# Patient Record
Sex: Male | Born: 1985 | Race: White | Hispanic: No | Marital: Single | Smoking: Current every day smoker
Health system: Southern US, Community
[De-identification: ages and names within clinical notes are randomized; demographics above are authoritative.]

## PROBLEM LIST (undated history)

## (undated) DIAGNOSIS — I10 Essential (primary) hypertension: Secondary | ICD-10-CM

## (undated) DIAGNOSIS — M549 Dorsalgia, unspecified: Secondary | ICD-10-CM

## (undated) DIAGNOSIS — F319 Bipolar disorder, unspecified: Secondary | ICD-10-CM

## (undated) DIAGNOSIS — R569 Unspecified convulsions: Secondary | ICD-10-CM

## (undated) HISTORY — PX: APPENDECTOMY: SHX54

---
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
## 2009-08-28 ENCOUNTER — Encounter (INDEPENDENT_AMBULATORY_CARE_PROVIDER_SITE_OTHER): Payer: Self-pay | Admitting: Surgery

## 2009-08-28 ENCOUNTER — Inpatient Hospital Stay (HOSPITAL_COMMUNITY): Admission: EM | Admit: 2009-08-28 | Discharge: 2009-08-30 | Payer: Self-pay | Admitting: Emergency Medicine

## 2010-12-02 ENCOUNTER — Emergency Department (HOSPITAL_COMMUNITY)
Admission: EM | Admit: 2010-12-02 | Discharge: 2010-12-02 | Disposition: A | Discharge status: Home / Self Care | Payer: Self-pay | Attending: Emergency Medicine | Admitting: Emergency Medicine

## 2010-12-02 DIAGNOSIS — F17200 Nicotine dependence, unspecified, uncomplicated: Secondary | ICD-10-CM | POA: Insufficient documentation

## 2010-12-02 DIAGNOSIS — F319 Bipolar disorder, unspecified: Secondary | ICD-10-CM | POA: Insufficient documentation

## 2010-12-02 LAB — BASIC METABOLIC PANEL
CO2: 25 mEq/L (ref 19–32)
Chloride: 108 mEq/L (ref 96–112)
GFR calc Af Amer: >60 mL/min (ref >60)
Sodium: 142 mEq/L (ref 135–145)

## 2010-12-02 LAB — DIFFERENTIAL
Basophils Absolute: 0 10*3/uL (ref 0.0–0.1)
Basophils Relative: 0 % (ref 0–1)
Eosinophils Absolute: 0.3 10*3/uL (ref 0.0–0.7)
Eosinophils Relative: 2 % (ref 0–5)
Lymphocytes Relative: 20 % (ref 12–46)
Lymphs Abs: 2.5 10*3/uL (ref 0.7–4.0)
Neutrophils Relative %: 68 % (ref 43–77)

## 2010-12-02 LAB — CBC
HCT: 47.9 % (ref 39.0–52.0)
MCHC: 35.5 g/dL (ref 30.0–36.0)
Platelets: 216 10*3/uL (ref 150–400)
RBC: 5.41 MIL/uL (ref 4.22–5.81)
RDW: 12.9 % (ref 11.5–15.5)
WBC: 12.6 10*3/uL — ABNORMAL HIGH (ref 4.0–10.5)

## 2011-01-24 LAB — COMPREHENSIVE METABOLIC PANEL
ALT: 41 U/L (ref 0–53)
BUN: 5 mg/dL — ABNORMAL LOW (ref 6–23)
BUN: 8 mg/dL (ref 6–23)
CO2: 25 mEq/L (ref 19–32)
CO2: 26 mEq/L (ref 19–32)
Calcium: 8.7 mg/dL (ref 8.4–10.5)
Calcium: 8.8 mg/dL (ref 8.4–10.5)
Creatinine, Ser: 0.92 mg/dL (ref 0.4–1.5)
Creatinine, Ser: 1.04 mg/dL (ref 0.4–1.5)
GFR calc Af Amer: >60 mL/min (ref >60)
GFR calc non Af Amer: >60 mL/min (ref >60)
GFR calc non Af Amer: >60 mL/min (ref >60)
Glucose, Bld: 101 mg/dL — ABNORMAL HIGH (ref 70–99)
Glucose, Bld: 101 mg/dL — ABNORMAL HIGH (ref 70–99)

## 2011-01-24 LAB — CBC
HCT: 39.9 % (ref 39.0–52.0)
HCT: 41 % (ref 39.0–52.0)
HCT: 48.1 % (ref 39.0–52.0)
Hemoglobin: 13.8 g/dL (ref 13.0–17.0)
Hemoglobin: 14.4 g/dL (ref 13.0–17.0)
Hemoglobin: 16.7 g/dL (ref 13.0–17.0)
MCHC: 34.8 g/dL (ref 30.0–36.0)
MCHC: 35 g/dL (ref 30.0–36.0)
MCV: 93.1 fL (ref 78.0–100.0)
MCV: 93.1 fL (ref 78.0–100.0)
RBC: 4.27 MIL/uL (ref 4.22–5.81)
RBC: 4.41 MIL/uL (ref 4.22–5.81)
RBC: 5.17 MIL/uL (ref 4.22–5.81)
WBC: 10.4 10*3/uL (ref 4.0–10.5)

## 2011-01-24 LAB — URINALYSIS, ROUTINE W REFLEX MICROSCOPIC
Ketones, ur: >80 mg/dL — AB
Nitrite: POSITIVE — AB
pH: 6 (ref 5.0–8.0)

## 2011-01-24 LAB — DIFFERENTIAL
Eosinophils Absolute: 0 10*3/uL (ref 0.0–0.7)
Lymphocytes Relative: 7 % — ABNORMAL LOW (ref 12–46)
Lymphs Abs: 1.3 10*3/uL (ref 0.7–4.0)
Neutrophils Relative %: 82 % — ABNORMAL HIGH (ref 43–77)

## 2011-01-24 LAB — URINE MICROSCOPIC-ADD ON

## 2011-01-24 LAB — LIPASE, BLOOD: Lipase: 24 U/L (ref 11–59)

## 2012-02-21 ENCOUNTER — Emergency Department (HOSPITAL_COMMUNITY): Payer: Self-pay

## 2012-02-21 ENCOUNTER — Emergency Department (HOSPITAL_COMMUNITY)
Admission: EM | Admit: 2012-02-21 | Discharge: 2012-02-21 | Disposition: A | Discharge status: Home / Self Care | Payer: Self-pay | Attending: Emergency Medicine | Admitting: Emergency Medicine

## 2012-02-21 ENCOUNTER — Encounter (HOSPITAL_COMMUNITY): Payer: Self-pay | Admitting: Emergency Medicine

## 2012-02-21 DIAGNOSIS — Z79899 Other long term (current) drug therapy: Secondary | ICD-10-CM | POA: Insufficient documentation

## 2012-02-21 DIAGNOSIS — M545 Low back pain, unspecified: Secondary | ICD-10-CM | POA: Insufficient documentation

## 2012-02-21 DIAGNOSIS — R059 Cough, unspecified: Secondary | ICD-10-CM | POA: Insufficient documentation

## 2012-02-21 DIAGNOSIS — M549 Dorsalgia, unspecified: Secondary | ICD-10-CM | POA: Insufficient documentation

## 2012-02-21 DIAGNOSIS — R05 Cough: Secondary | ICD-10-CM

## 2012-02-21 HISTORY — DX: Dorsalgia, unspecified: M54.9

## 2012-02-21 LAB — DIFFERENTIAL
Basophils Relative: 0 % (ref 0–1)
Eosinophils Absolute: 0.4 10*3/uL (ref 0.0–0.7)
Lymphs Abs: 4.3 10*3/uL — ABNORMAL HIGH (ref 0.7–4.0)
Monocytes Absolute: 1.1 10*3/uL — ABNORMAL HIGH (ref 0.1–1.0)
Monocytes Relative: 8 % (ref 3–12)
Neutro Abs: 7.5 10*3/uL (ref 1.7–7.7)

## 2012-02-21 LAB — BASIC METABOLIC PANEL
BUN: 13 mg/dL (ref 6–23)
Chloride: 103 mEq/L (ref 96–112)
Creatinine, Ser: 1.06 mg/dL (ref 0.50–1.35)
GFR calc Af Amer: >90 mL/min (ref >90)
Glucose, Bld: 96 mg/dL (ref 70–99)

## 2012-02-21 LAB — URINALYSIS, ROUTINE W REFLEX MICROSCOPIC
Bilirubin Urine: NEGATIVE
Hgb urine dipstick: NEGATIVE
Ketones, ur: NEGATIVE mg/dL
Nitrite: NEGATIVE
Protein, ur: NEGATIVE mg/dL
Specific Gravity, Urine: 1.009 (ref 1.005–1.030)
Urobilinogen, UA: 0.2 mg/dL (ref 0.0–1.0)

## 2012-02-21 LAB — RAPID URINE DRUG SCREEN, HOSP PERFORMED
Amphetamines: NOT DETECTED
Barbiturates: NOT DETECTED
Cocaine: NOT DETECTED
Tetrahydrocannabinol: POSITIVE — AB

## 2012-02-21 LAB — CBC
HCT: 46.8 % (ref 39.0–52.0)
Hemoglobin: 16.5 g/dL (ref 13.0–17.0)
MCH: 31.4 pg (ref 26.0–34.0)
MCHC: 35.3 g/dL (ref 30.0–36.0)
RBC: 5.25 MIL/uL (ref 4.22–5.81)

## 2012-02-21 MED ORDER — DIAZEPAM 10 MG PO TABS: 10.0000 mg | ORAL_TABLET | Freq: Four times a day (QID) | ORAL | Status: AC | PRN

## 2012-02-21 MED ORDER — DIAZEPAM 5 MG PO TABS
10.0000 mg | ORAL_TABLET | Freq: Once | ORAL | Status: AC
  Administered 2012-02-21: 10 mg via ORAL
  Filled 2012-02-21: qty 2

## 2012-02-21 MED ORDER — ONDANSETRON 4 MG PO TBDP
8.0000 mg | ORAL_TABLET | Freq: Once | ORAL | Status: AC
  Administered 2012-02-21: 8 mg via ORAL
  Filled 2012-02-21: qty 2

## 2012-02-21 MED ORDER — OXYCODONE-ACETAMINOPHEN 5-325 MG PO TABS
1.0000 | ORAL_TABLET | Freq: Once | ORAL | Status: AC
  Administered 2012-02-21: 1 via ORAL
  Filled 2012-02-21: qty 1

## 2012-02-21 MED ORDER — OXYCODONE-ACETAMINOPHEN 5-325 MG PO TABS: 2.0000 | ORAL_TABLET | ORAL | Status: AC | PRN

## 2012-02-21 MED ORDER — HYDROMORPHONE HCL PF 2 MG/ML IJ SOLN
2.0000 mg | Freq: Once | INTRAMUSCULAR | Status: AC
  Administered 2012-02-21: 2 mg via INTRAMUSCULAR
  Filled 2012-02-21: qty 1

## 2012-02-21 NOTE — ED Notes (Signed)
Patient in wheelchair for different position of comfort.

## 2012-02-21 NOTE — ED Notes (Signed)
Patient with lower back pain, starting early this afternoon after playing basketball and doing yardwork.  Patient states that he is unable to lay on back, tearful.  Patient states he is having sharp spasms down spine and at base of spine.

## 2012-02-21 NOTE — ED Notes (Signed)
PT. 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

## 2012-02-21 NOTE — ED Notes (Addendum)
Pain is starting to return, patient states that it is 8/10, MD notified.

## 2012-02-21 NOTE — ED Provider Notes (Addendum)
History     CSN: [id number]  Arrival date & time 02/21/12  Rich Fuchs   First MD Initiated Contact with Patient 02/21/12 0112      Chief Complaint  Patient presents with  . Back Pain    (Consider location/radiation/quality/duration/timing/severity/associated sxs/prior treatment) HPI Comments: Dennis Valenzuela is a 26 y.o. male who states he has had back pain for many years. It is worsening. He does not get regular treatments for it. He saw a chiropractor once, but did not have adjustment. The back pain has worsened, since he began coughing, 3 weeks ago;  when he coughs he brings up "bile". He denies change in bowel or urinary habits. He has had no incontinence. He denies extremity pain or paresthesia. He denies trauma to the back. He is not taking any medicine for the problem. The pain worsens when he moves about.  Patient is a 26 y.o. male presenting with back pain. The history is provided by the patient.  Back Pain     Past Medical History  Diagnosis Date  . Back pain     Past Surgical History  Procedure Date  . Appendectomy     No family history on file.  History  Substance Use Topics  . Smoking status: Current Everyday Smoker  . Smokeless tobacco: Not on file  . Alcohol Use: Yes      Review of Systems  Musculoskeletal: Positive for back pain.  All other systems reviewed and are negative.    Allergies  Penicillins  Home Medications   Current Outpatient Rx  Name Route Sig Dispense Refill  . IBUPROFEN 200 MG PO TABS Oral Take 800 mg by mouth every 6 (six) hours as needed.    Marland Kitchen DIAZEPAM 10 MG PO TABS Oral Take 1 tablet (10 mg total) by mouth every 6 (six) hours as needed for anxiety. 20 tablet 0  . OXYCODONE-ACETAMINOPHEN 5-325 MG PO TABS Oral Take 2 tablets by mouth every 4 (four) hours as needed for pain. 20 tablet 0    BP 118/72  Pulse 72  Temp(Src) 97.8 F (36.6 C) (Oral)  Resp 18  SpO2 100%  Physical Exam  Nursing note and vitals  reviewed. Constitutional: He is oriented to person, place, and time. He appears well-developed and well-nourished.  HENT:  Head: Normocephalic and atraumatic.  Right Ear: External ear normal.  Left Ear: External ear normal.  Eyes: Conjunctivae and EOM are normal. Pupils are equal, round, and reactive to light.  Neck: Normal range of motion and phonation normal. Neck supple.  Cardiovascular: Normal rate, regular rhythm, normal heart sounds and intact distal pulses.   Pulmonary/Chest: Effort normal. He has no wheezes. He has no rales. He exhibits no tenderness and no bony tenderness.       Scattered rhonchi  Abdominal: Soft. Normal appearance. There is no tenderness.  Musculoskeletal: Normal range of motion.       He is moderate right lumbar tenderness. There is no pain to palpation or deformity of the cervical, thoracic, or lumbar spine.  Neurological: He is alert and oriented to person, place, and time. He has normal strength. No cranial nerve deficit or sensory deficit. He exhibits normal muscle tone. Coordination normal.  Skin: Skin is warm, dry and intact.  Psychiatric: He has a normal mood and affect. His behavior is normal. Judgment and thought content normal.    ED Course  Procedures (including critical care time) Emergency department treatment: IM Dilaudid, and oral Zofran  3:51 AM Reevaluation with update  and discussion. After initial assessment and treatment, an updated evaluation reveals Pain better now. Oral meds started. Annalie Wenner L     Date: 02/21/2012  Rate: 56  Rhythm: sinus bradycardia  QRS Axis: normal  Intervals: normal  ST/T Wave abnormalities: normal  Conduction Disutrbances:none  Narrative Interpretation:   Old EKG Reviewed: none available   Labs Reviewed  CBC - Abnormal; Notable for the following:    WBC 13.2 (*)    All other components within normal limits  DIFFERENTIAL - Abnormal; Notable for the following:    Lymphs Abs 4.3 (*)    Monocytes  Absolute 1.1 (*)    All other components within normal limits  URINE RAPID DRUG SCREEN (HOSP PERFORMED) - Abnormal; Notable for the following:    Tetrahydrocannabinol POSITIVE (*)    All other components within normal limits  BASIC METABOLIC PANEL  TROPONIN I  ETHANOL  URINALYSIS, ROUTINE W REFLEX MICROSCOPIC   Dg Chest 2 View  02/21/2012  *RADIOLOGY REPORT*  Clinical Data: Cough, back pain.  CHEST - 2 VIEW  Comparison: None.  Findings: Lungs are clear. No pleural effusion or pneumothorax. The cardiomediastinal contours are within normal limits. The visualized bones and soft tissues are without significant appreciable abnormality.  IMPRESSION: No acute cardiopulmonary process.  Original Report Authenticated By: Waneta Martins, M.D.   Dg Lumbar Spine Complete  02/21/2012  *RADIOLOGY REPORT*  Clinical Data: Lower back pain  LUMBAR SPINE - COMPLETE 4+ VIEW  Comparison: 08/28/2009 CT  Findings: The imaged vertebral bodies and inter-vertebral disc spaces are maintained. No displaced acute fracture or dislocation identified.   The para-vertebral and overlying soft tissues are within normal limits.  IMPRESSION: No acute osseous abnormality.  Original Report Authenticated By: Waneta Martins, M.D.     1. Back pain   2. Cough       MDM  Non specific LBP : Doubt cauda equina, spinal stenosis or occult infection. Chest x-ray is without evidence for pneumonia. He does not clinically have bronchitis. He is stable for discharge with outpatient   Plan: Home Medications- Percocet and Valium; Home Treatments- rest; Recommended follow up- Neurosurg for chronic back pain        Flint Melter, MD 02/21/12 0354  Flint Melter, MD 02/21/12 [phone]

## 2012-02-21 NOTE — Discharge Instructions (Signed)
Try to stop smoking. Call the back specialist for an appointment to be seen for further evaluation and treatment.   Back Exercises Back exercises help treat and prevent back injuries. The goal of back exercises is to increase the strength of your abdominal and back muscles and the flexibility of your back. These exercises should be started when you no longer have back pain. Back exercises include:  Pelvic Tilt. Lie on your back with your knees bent. Tilt your pelvis until the lower part of your back is against the floor. Hold this position 5 to 10 sec and repeat 5 to 10 times.   Knee to Chest. Pull first 1 knee up against your chest and hold for 20 to 30 seconds, repeat this with the other knee, and then both knees. This may be done with the other leg straight or bent, whichever feels better.   Sit-Ups or Curl-Ups. Bend your knees 90 degrees. Start with tilting your pelvis, and do a partial, slow sit-up, lifting your trunk only 30 to 45 degrees off the floor. Take at least 2 to 3 seconds for each sit-up. Do not do sit-ups with your knees out straight. If partial sit-ups are difficult, simply do the above but with only tightening your abdominal muscles and holding it as directed.   Hip-Lift. Lie on your back with your knees flexed 90 degrees. Push down with your feet and shoulders as you raise your hips a couple inches off the floor; hold for 10 seconds, repeat 5 to 10 times.   Back arches. Lie on your stomach, propping yourself up on bent elbows. Slowly press on your hands, causing an arch in your low back. Repeat 3 to 5 times. Any initial stiffness and discomfort should lessen with repetition over time.   Shoulder-Lifts. Lie face down with arms beside your body. Keep hips and torso pressed to floor as you slowly lift your head and shoulders off the floor.  Do not overdo your exercises, especially in the beginning. Exercises may cause you some mild back discomfort which lasts for a few minutes;  however, if the pain is more severe, or lasts for more than 15 minutes, do not continue exercises until you see your caregiver. Improvement with exercise therapy for back problems is slow.  See your caregivers for assistance with developing a proper back exercise program. Document Released: 11/15/2004 Document Revised: 09/27/2011 Document Reviewed: 10/08/2005 [facility] Patient Information 2012 [place], [place].Back Pain, Adult Low back pain is very common. About 1 in 5 people have back pain.The cause of low back pain is rarely dangerous. The pain often gets better over time.About half of people with a sudden onset of back pain feel better in just 2 weeks. About 8 in 10 people feel better by 6 weeks.  CAUSES Some common causes of back pain include:  Strain of the muscles or ligaments supporting the spine.   Wear and tear (degeneration) of the spinal discs.   Arthritis.   Direct injury to the back.  DIAGNOSIS Most of the time, the direct cause of low back pain is not known.However, back pain can be treated effectively even when the exact cause of the pain is unknown.Answering your caregiver's questions about your overall health and symptoms is one of the most accurate ways to make sure the cause of your pain is not dangerous. If your caregiver needs more information, he or she may order lab work or imaging tests (X-rays or MRIs).However, even if imaging tests show changes in your  back, this usually does not require surgery. HOME CARE INSTRUCTIONS For many people, back pain returns.Since low back pain is rarely dangerous, it is often a condition that people can learn to [facility] their own.   Remain active. It is stressful on the back to sit or stand in one place. Do not sit, drive, or stand in one place for more than 30 minutes at a time. Take short walks on level surfaces as soon as pain allows.Try to increase the length of time you walk each day.   Do not stay in bed.Resting more than 1  or 2 days can delay your recovery.   Do not avoid exercise or work.Your body is made to move.It is not dangerous to be active, even though your back may hurt.Your back will likely heal faster if you return to being active before your pain is gone.   Pay attention to your body when you bend and lift. Many people have less discomfortwhen lifting if they bend their knees, keep the load close to their bodies,and avoid twisting. Often, the most comfortable positions are those that put less stress on your recovering back.   Find a comfortable position to sleep. Use a firm mattress and lie on your side with your knees slightly bent. If you lie on your back, put a pillow under your knees.   Only take over-the-counter or prescription medicines as directed by your caregiver. Over-the-counter medicines to reduce pain and inflammation are often the most helpful.Your caregiver may prescribe muscle relaxant drugs.These medicines help dull your pain so you can more quickly return to your normal activities and healthy exercise.   Put ice on the injured area.   Put ice in a plastic bag.   Place a towel between your skin and the bag.   Leave the ice on for 15 to 20 minutes, 3 to 4 times a day for the first 2 to 3 days. After that, ice and heat may be alternated to reduce pain and spasms.   Ask your caregiver about trying back exercises and gentle massage. This may be of some benefit.   Avoid feeling anxious or stressed.Stress increases muscle tension and can worsen back pain.It is important to recognize when you are anxious or stressed and learn ways to manage it.Exercise is a great option.  SEEK MEDICAL CARE IF:  You have pain that is not relieved with rest or medicine.   You have pain that does not improve in 1 week.   You have new symptoms.   You are generally not feeling well.  SEEK IMMEDIATE MEDICAL CARE IF:   You have pain that radiates from your back into your legs.   You develop  new bowel or bladder control problems.   You have unusual weakness or numbness in your arms or legs.   You develop nausea or vomiting.   You develop abdominal pain.   You feel faint.  Document Released: 10/08/2005 Document Revised: 09/27/2011 Document Reviewed: 02/26/2011 [facility] Patient Information 2012 [place], [place].Cough, Adult  A cough is a reflex that helps clear your throat and airways. It can help heal the body or may be a reaction to an irritated airway. A cough may only last 2 or 3 weeks (acute) or may last more than 8 weeks (chronic).  CAUSES Acute cough:  Viral or bacterial infections.  Chronic cough:  Infections.   Allergies.   Asthma.   Post-nasal drip.   Smoking.   Heartburn or acid reflux.   Some medicines.  Chronic lung problems (COPD).   Cancer.  SYMPTOMS   Cough.   Fever.   Chest pain.   Increased breathing rate.   High-pitched whistling sound when breathing (wheezing).   Colored mucus that you cough up (sputum).  TREATMENT   A bacterial cough may be treated with antibiotic medicine.   A viral cough must run its course and will not respond to antibiotics.   Your caregiver may recommend other treatments if you have a chronic cough.  HOME CARE INSTRUCTIONS   Only take over-the-counter or prescription medicines for pain, discomfort, or fever as directed by your caregiver. Use cough suppressants only as directed by your caregiver.   Use a cold steam vaporizer or humidifier in your bedroom or home to help loosen secretions.   Sleep in a semi-upright position if your cough is worse at night.   Rest as needed.   Stop smoking if you smoke.  SEEK IMMEDIATE MEDICAL CARE IF:   You have pus in your sputum.   Your cough starts to worsen.   You cannot control your cough with suppressants and are losing sleep.   You begin coughing up blood.   You have difficulty breathing.   You develop pain which is getting worse or is uncontrolled  with medicine.   You have a fever.  MAKE SURE YOU:   Understand these instructions.   Will watch your condition.   Will get help right away if you are not doing well or get worse.  Document Released: 04/06/2011 Document Revised: 09/27/2011 Document Reviewed: 04/06/2011 [facility] Patient Information 2012 [place], [place].

## 2013-03-12 ENCOUNTER — Emergency Department (HOSPITAL_COMMUNITY)
Admission: EM | Admit: 2013-03-12 | Discharge: 2013-03-12 | Disposition: A | Discharge status: Home / Self Care | Payer: BC Managed Care – PPO | Attending: Emergency Medicine | Admitting: Emergency Medicine

## 2013-03-12 ENCOUNTER — Emergency Department (HOSPITAL_COMMUNITY): Payer: BC Managed Care – PPO

## 2013-03-12 ENCOUNTER — Encounter (HOSPITAL_COMMUNITY): Payer: Self-pay | Admitting: Emergency Medicine

## 2013-03-12 DIAGNOSIS — R55 Syncope and collapse: Secondary | ICD-10-CM | POA: Insufficient documentation

## 2013-03-12 DIAGNOSIS — F17200 Nicotine dependence, unspecified, uncomplicated: Secondary | ICD-10-CM | POA: Insufficient documentation

## 2013-03-12 DIAGNOSIS — R42 Dizziness and giddiness: Secondary | ICD-10-CM | POA: Insufficient documentation

## 2013-03-12 DIAGNOSIS — I10 Essential (primary) hypertension: Secondary | ICD-10-CM | POA: Insufficient documentation

## 2013-03-12 DIAGNOSIS — R51 Headache: Secondary | ICD-10-CM | POA: Insufficient documentation

## 2013-03-12 DIAGNOSIS — M549 Dorsalgia, unspecified: Secondary | ICD-10-CM | POA: Insufficient documentation

## 2013-03-12 HISTORY — DX: Unspecified convulsions: R56.9

## 2013-03-12 HISTORY — DX: Essential (primary) hypertension: I10

## 2013-03-12 LAB — CBC WITH DIFFERENTIAL/PLATELET
Basophils Relative: 0 % (ref 0–1)
Eosinophils Relative: 3 % (ref 0–5)
HCT: 48.8 % (ref 39.0–52.0)
Hemoglobin: 17.3 g/dL — ABNORMAL HIGH (ref 13.0–17.0)
Lymphocytes Relative: 31 % (ref 12–46)
MCHC: 35.5 g/dL (ref 30.0–36.0)
MCV: 88.2 fL (ref 78.0–100.0)
Monocytes Absolute: 0.8 10*3/uL (ref 0.1–1.0)
Monocytes Relative: 7 % (ref 3–12)
Neutro Abs: 6.5 10*3/uL (ref 1.7–7.7)

## 2013-03-12 LAB — COMPREHENSIVE METABOLIC PANEL
BUN: 13 mg/dL (ref 6–23)
CO2: 25 mEq/L (ref 19–32)
Calcium: 9.3 mg/dL (ref 8.4–10.5)
Chloride: 105 mEq/L (ref 96–112)
Creatinine, Ser: 0.97 mg/dL (ref 0.50–1.35)
GFR calc non Af Amer: >90 mL/min (ref >90)
Total Bilirubin: 0.4 mg/dL (ref 0.3–1.2)

## 2013-03-12 NOTE — ED Notes (Signed)
Pt dc'd home w/all belongings, alert, and ambulatory upon dc, no new rx prescribed, driven home by mother

## 2013-03-12 NOTE — ED Provider Notes (Signed)
History     CSN: [id number]  Arrival date & time 03/12/13  0023   First MD Initiated Contact with Patient 03/12/13 0235      Chief Complaint  Patient presents with  . Seizures    (Consider location/radiation/quality/duration/timing/severity/associated sxs/prior treatment) HPI Comments: Patient presents for eval of episodes of shaking, confusion, sweating, blurry vision that have been occurring intermittently for the past year, more frequent over the past few weeks.  These episodes can occur without warning and do not seem to be precipitated by physical or emotional stress.  Episodes can last up to 45 minutes and reports feeling disoriented afterward.  He denies completely losing consciousness.  Patient is a 27 y.o. male presenting with seizures. The history is provided by the patient.  Seizures Seizure activity on arrival: no   Preceding symptoms: dizziness and headache   Preceding symptoms: no sensation of an aura present   Initial focality:  None Episode characteristics: abnormal movements, confusion, disorientation, generalized shaking and incontinence     Past Medical History  Diagnosis Date  . Back pain   . Hypertension   . Seizure     Past Surgical History  Procedure Laterality Date  . Appendectomy      No family history on file.  History  Substance Use Topics  . Smoking status: Current Every Day Smoker  . Smokeless tobacco: Not on file  . Alcohol Use: Yes      Review of Systems  Neurological: Positive for seizures.  All other systems reviewed and are negative.    Allergies  Penicillins  Home Medications   Current Outpatient Rx  Name  Route  Sig  Dispense  Refill  . acetaminophen (TYLENOL) 500 MG tablet   Oral   Take 500 mg by mouth every 4 (four) hours as needed for pain.           BP 168/114  Pulse 83  Temp(Src) 98.1 F (36.7 C)  Resp 18  SpO2 98%  Physical Exam  Nursing note and vitals reviewed. Constitutional: He is oriented to  person, place, and time. He appears well-developed and well-nourished. No distress.  HENT:  Head: Normocephalic and atraumatic.  Mouth/Throat: Oropharynx is clear and moist.  Eyes: EOM are normal. Pupils are equal, round, and reactive to light.  Neck: Normal range of motion. Neck supple.  Cardiovascular: Normal rate and regular rhythm.   No murmur heard. Pulmonary/Chest: Effort normal and breath sounds normal. No respiratory distress. He has no wheezes.  Abdominal: Soft. Bowel sounds are normal. He exhibits no distension. There is no tenderness.  Musculoskeletal: Normal range of motion. He exhibits no edema.  Lymphadenopathy:    He has no cervical adenopathy.  Neurological: He is alert and oriented to person, place, and time. No cranial nerve deficit. He exhibits normal muscle tone. Coordination normal.  Skin: Skin is warm and dry. He is not diaphoretic.    ED Course  Procedures (including critical care time)  Labs Reviewed  CBC WITH DIFFERENTIAL - Abnormal; Notable for the following:    WBC 11.1 (*)    Hemoglobin 17.3 (*)    All other components within normal limits  COMPREHENSIVE METABOLIC PANEL - Abnormal; Notable for the following:    Glucose, Bld 109 (*)    All other components within normal limits   No results found.   No diagnosis found.   Date: 03/12/2013  Rate: 70's  Rhythm: normal sinus rhythm  QRS Axis: normal  Intervals: normal  ST/T Wave abnormalities:  normal  Conduction Disutrbances:none  Narrative Interpretation:   Old EKG Reviewed: none available    MDM  The patient presents here with complaints of episodes that sound potentially like seizures.  The workup does not reveal any obvious etiology and the neuro exam is non-focal.  He appears stable for discharge, however I would like to encourage follow up with neurology to discuss.  He was warned not to drive or swim or partake in other activities where one of these episodes could put him in harm's  way.        Geoffery Lyons, MD 03/12/13 [phone]

## 2013-03-12 NOTE — ED Notes (Signed)
PT. REPORTS SEIZURE 3 WEEKS AGO , ALSO REPORTS FINGERS AND FEET NUMBNESS WITH NASAL CONGESTION FOR SEVERAL DAYS . PT. STATED HE IS NOT TAKING ANY SEIZURE MEDICATION .

## 2013-03-12 NOTE — ED Notes (Signed)
Patient transported to CT 

## 2013-03-12 NOTE — ED Notes (Addendum)
Pt reports one year of "seizure symptoms" occuring at first every month and now every couple of weeks the last one being May 7th. Reports feeling "stuffy headed, SOB, red faced, blurred vision, vomiting and defecating on self at same time, dizzy and then passing out and convulsing" Mother states he is at times aware and alert and talking and other times he is on the floor "shaking" pt is alert and oriented at this time. MAEx4, neurologically intact.

## 2013-03-12 NOTE — ED Notes (Signed)
Pt returned from Ct, placed back on monitor

## 2014-07-11 IMAGING — CT CT HEAD W/O CM
1 series · 16 of 30 positions shown, 20 images · non-contrast
Comparison: 08/26/2007.

CLINICAL DATA: Seizure.

CT HEAD WITHOUT CONTRAST
TECHNIQUE: Contiguous axial images were obtained from the base of
the skull through the vertex without contrast.

[Series 2: head routine 4.8 h37s · axial · 0.50mm/px · z∈[-206,-48]mm · 16 of 36 slices shown, 20 images]
[im 2/36  brain]
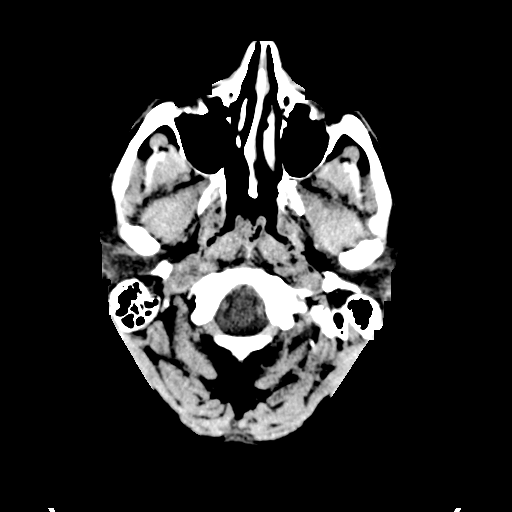
[im 2/36  bone]
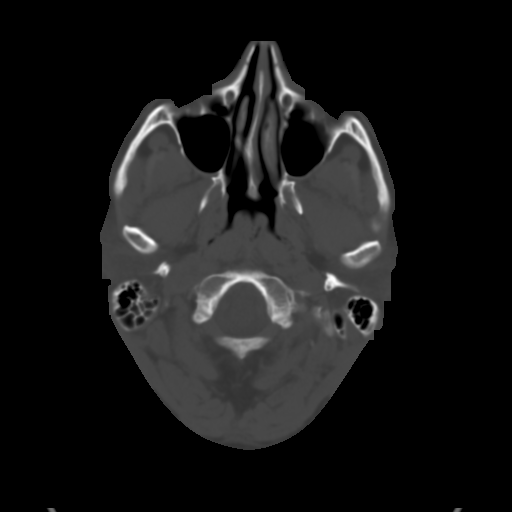
[im 4/36  brain]
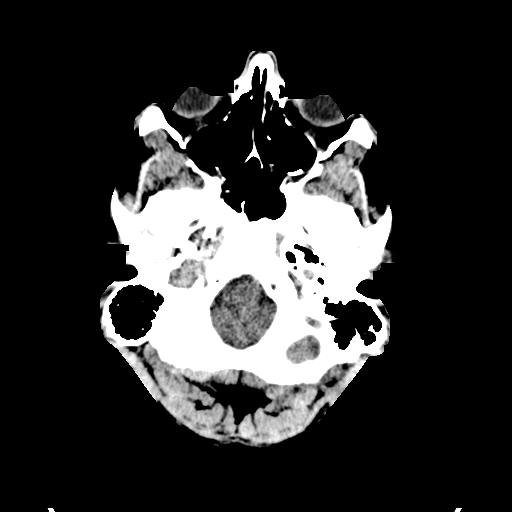
[im 7/36  brain]
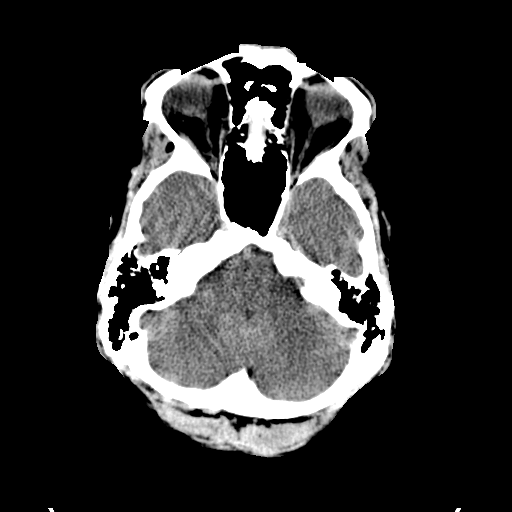
[im 9/36  brain]
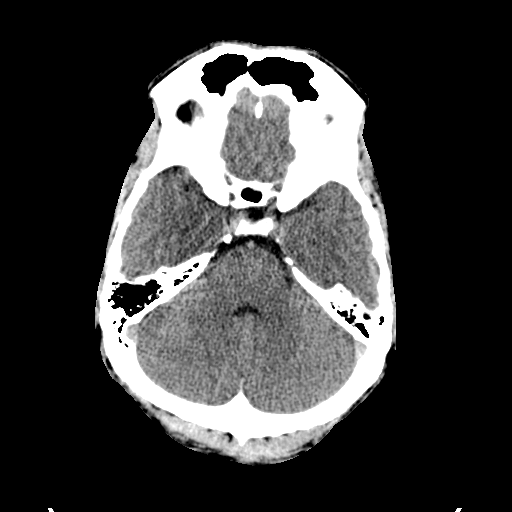
[im 10/36  brain]
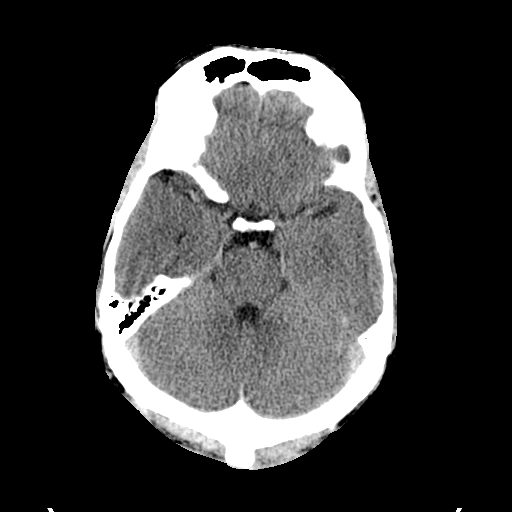
[im 10/36  bone]
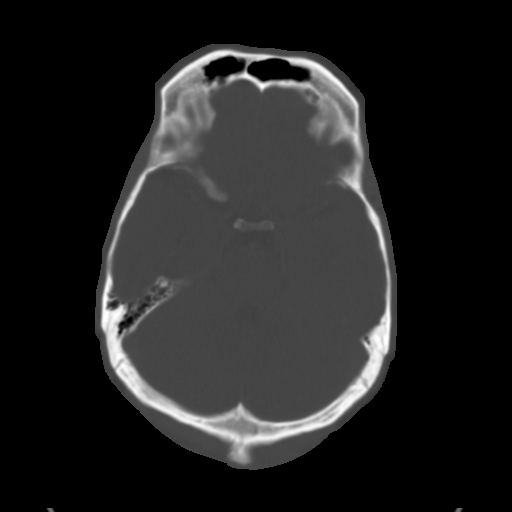
[im 13/36  brain]
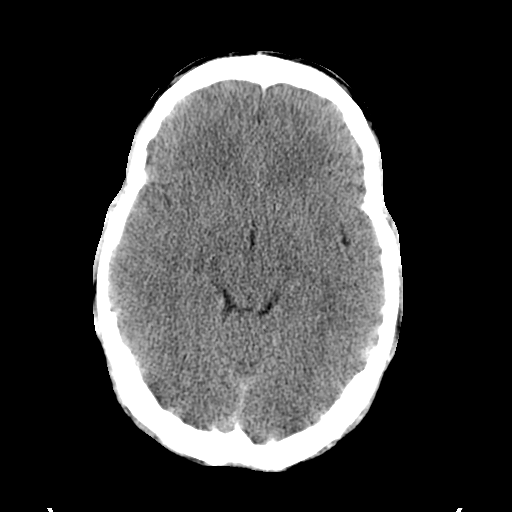
[im 15/36  brain]
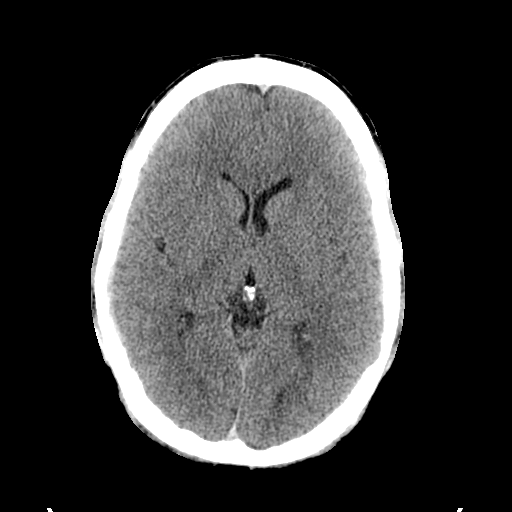
[im 17/36  brain]
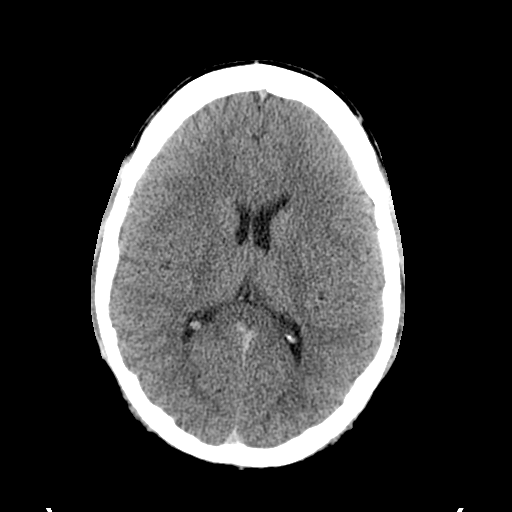
[im 19/36  brain]
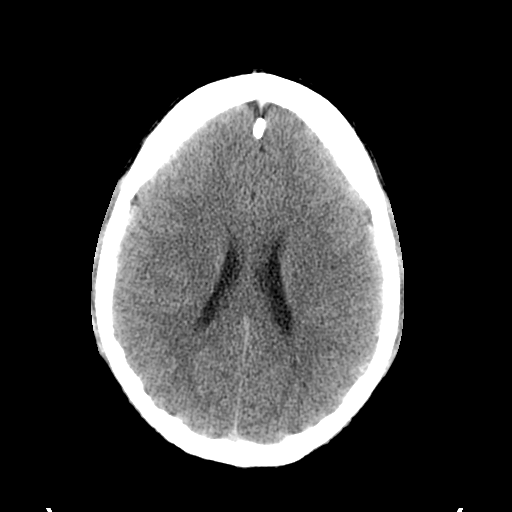
[im 19/36  bone]
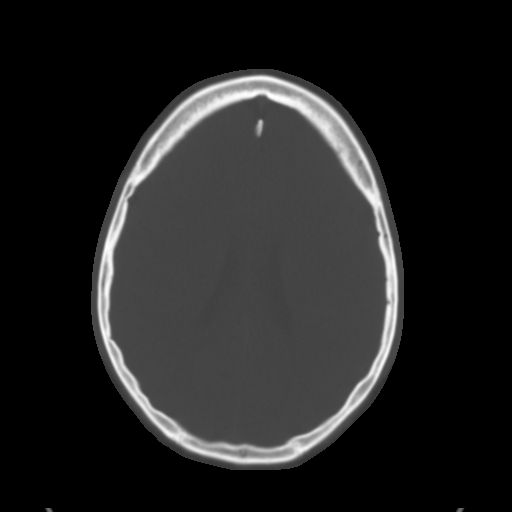
[im 21/36  brain]
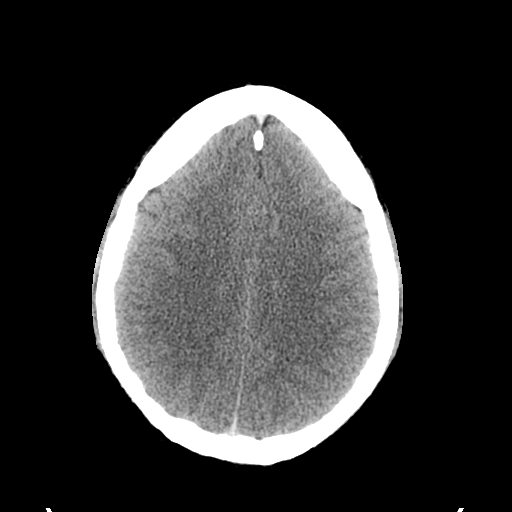
[im 23/36  brain]
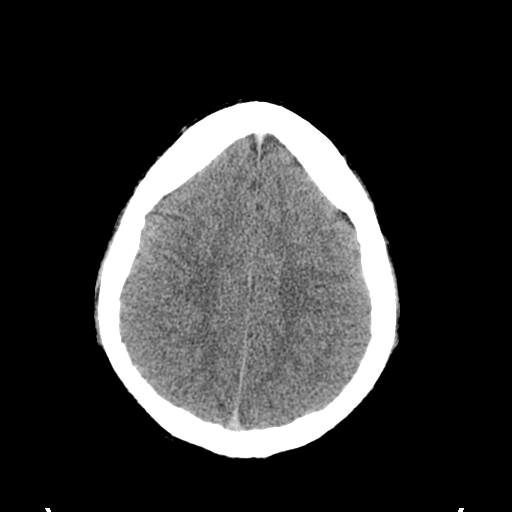
[im 26/36  brain]
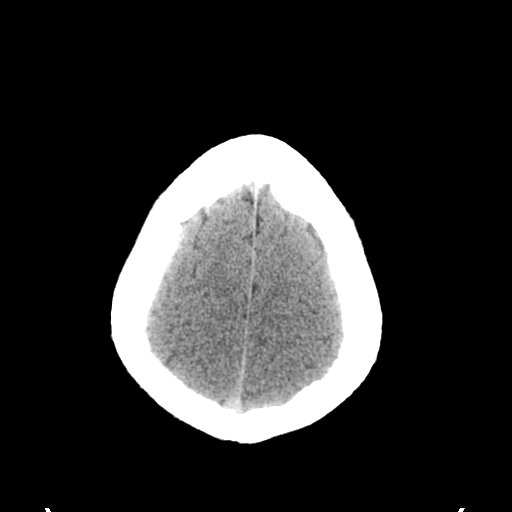
[im 27/36  brain]
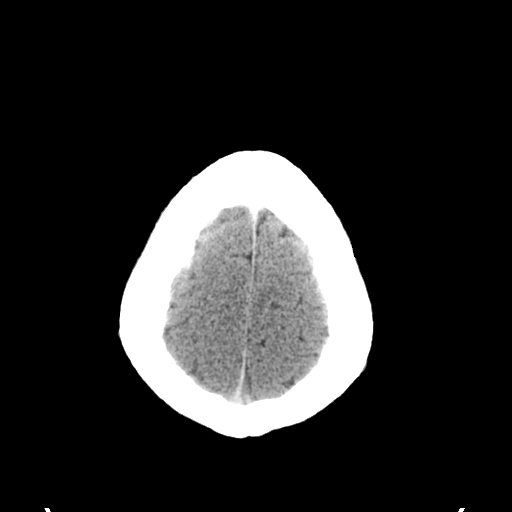
[im 27/36  bone]
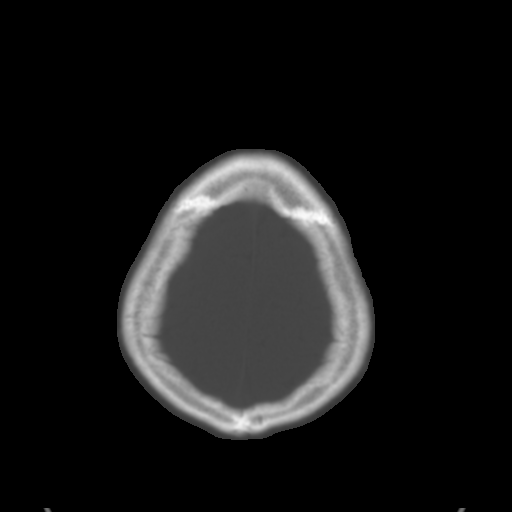
[im 29/36  brain]
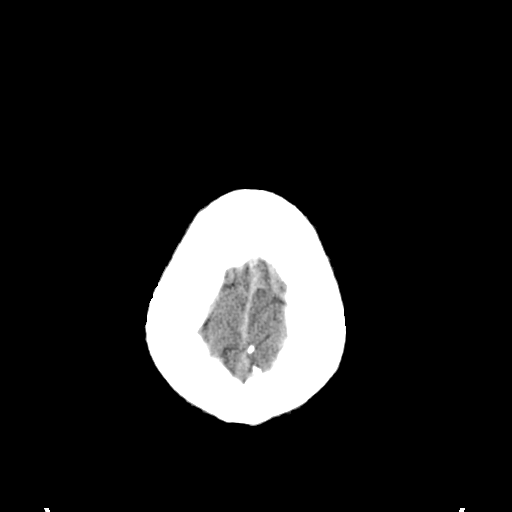
[im 32/36  brain]
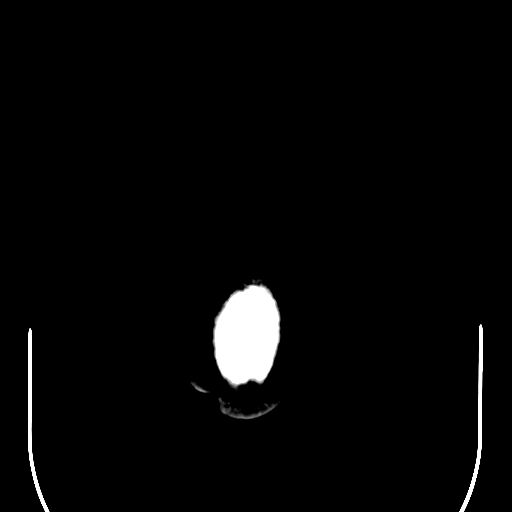
[im 34/36  brain]
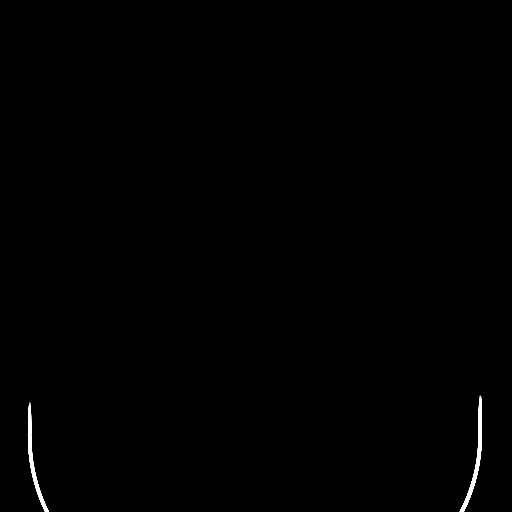

[16 of 30 positions shown; findings below may reference images not displayed]

FINDINGS: The ventricles are normal.  No extra-axial fluid
collections are seen.  The brainstem and cerebellum are
unremarkable.  No acute intracranial findings such as infarction or
hemorrhage.  No mass lesions.

The bony calvarium is intact.  The visualized paranasal sinuses and
mastoid air cells are clear.
IMPRESSION: Normal head CT.

## 2015-10-13 ENCOUNTER — Emergency Department (HOSPITAL_COMMUNITY)
Admission: EM | Admit: 2015-10-13 | Discharge: 2015-10-13 | Disposition: A | Discharge status: Home / Self Care | Payer: Self-pay | Attending: Emergency Medicine | Admitting: Emergency Medicine

## 2015-10-13 ENCOUNTER — Encounter (HOSPITAL_COMMUNITY): Payer: Self-pay | Admitting: *Deleted

## 2015-10-13 DIAGNOSIS — F3111 Bipolar disorder, current episode manic without psychotic features, mild: Secondary | ICD-10-CM | POA: Insufficient documentation

## 2015-10-13 DIAGNOSIS — R630 Anorexia: Secondary | ICD-10-CM | POA: Insufficient documentation

## 2015-10-13 DIAGNOSIS — I10 Essential (primary) hypertension: Secondary | ICD-10-CM | POA: Insufficient documentation

## 2015-10-13 DIAGNOSIS — Z880 Allergy status to penicillin: Secondary | ICD-10-CM | POA: Insufficient documentation

## 2015-10-13 DIAGNOSIS — F17210 Nicotine dependence, cigarettes, uncomplicated: Secondary | ICD-10-CM | POA: Insufficient documentation

## 2015-10-13 HISTORY — DX: Bipolar disorder, unspecified: F31.9

## 2015-10-13 LAB — BASIC METABOLIC PANEL
ANION GAP: 8 (ref 5–15)
BUN: 14 mg/dL (ref 6–20)
CALCIUM: 9.1 mg/dL (ref 8.9–10.3)
CHLORIDE: 108 mmol/L (ref 101–111)
CO2: 23 mmol/L (ref 22–32)
Creatinine, Ser: 0.98 mg/dL (ref 0.61–1.24)
GFR calc non Af Amer: >60 mL/min (ref >60)
Glucose, Bld: 94 mg/dL (ref 65–99)
POTASSIUM: 4.2 mmol/L (ref 3.5–5.1)
Sodium: 139 mmol/L (ref 135–145)

## 2015-10-13 LAB — RAPID URINE DRUG SCREEN, HOSP PERFORMED
AMPHETAMINES: NOT DETECTED
BARBITURATES: NOT DETECTED
BENZODIAZEPINES: NOT DETECTED
COCAINE: NOT DETECTED
Opiates: NOT DETECTED
TETRAHYDROCANNABINOL: POSITIVE — AB

## 2015-10-13 LAB — ETHANOL

## 2015-10-13 LAB — CBC WITH DIFFERENTIAL/PLATELET
BASOS ABS: 0 10*3/uL (ref 0.0–0.1)
BASOS PCT: 0 %
Eosinophils Absolute: 0.3 10*3/uL (ref 0.0–0.7)
Eosinophils Relative: 2 %
HEMATOCRIT: 48.4 % (ref 39.0–52.0)
HEMOGLOBIN: 17.4 g/dL — AB (ref 13.0–17.0)
LYMPHS PCT: 22 %
Lymphs Abs: 3.1 10*3/uL (ref 0.7–4.0)
MCH: 32.3 pg (ref 26.0–34.0)
MCHC: 36 g/dL (ref 30.0–36.0)
MCV: 89.8 fL (ref 78.0–100.0)
Monocytes Absolute: 1.2 10*3/uL — ABNORMAL HIGH (ref 0.1–1.0)
Monocytes Relative: 9 %
NEUTROS ABS: 9.4 10*3/uL — AB (ref 1.7–7.7)
NEUTROS PCT: 67 %
Platelets: 214 10*3/uL (ref 150–400)
RBC: 5.39 MIL/uL (ref 4.22–5.81)
RDW: 12.3 % (ref 11.5–15.5)
WBC: 14 10*3/uL — ABNORMAL HIGH (ref 4.0–10.5)

## 2015-10-13 LAB — LITHIUM LEVEL

## 2015-10-13 MED ORDER — LITHIUM CARBONATE 300 MG PO TABS: 300.0000 mg | ORAL_TABLET | Freq: Two times a day (BID) | ORAL | Status: DC

## 2015-10-13 MED ORDER — LITHIUM CARBONATE 300 MG PO CAPS
300.0000 mg | ORAL_CAPSULE | Freq: Once | ORAL | Status: AC
  Administered 2015-10-13: 300 mg via ORAL
  Filled 2015-10-13: qty 1

## 2015-10-13 MED ORDER — LITHIUM CARBONATE 300 MG PO CAPS
ORAL_CAPSULE | ORAL | Status: AC
  Filled 2015-10-13: qty 1

## 2015-10-13 NOTE — ED Notes (Signed)
Pt alert & oriented x4, stable gait. Patient  given discharge instructions, paperwork & prescription(s). Patient verbalized understanding. Pt left department w/ no further questions. 

## 2015-10-13 NOTE — ED Notes (Signed)
Pt states he has not had his lithium in about 4 to 6 weeks.

## 2015-10-13 NOTE — ED Notes (Signed)
Pt stating he can not stay in room & needs to go outside. Pt advised to allow this nurse to speak to the MD.

## 2015-10-13 NOTE — ED Notes (Signed)
TTS in process 

## 2015-10-13 NOTE — BH Assessment (Addendum)
Tele Assessment Note   Dennis Valenzuela is an 29 y.o. single male who came to the [facility] tonight reporting symptoms of Bipolar Disorder (Mania-Irritability) and stating he had not been taking his prescribed medications for over 1 month. Pt denies strongly SI, HI, SHI and AVH.  Pt sts that Lithium works for him but he did not have transportation to get back to [facility] to see the doctor when his prescription ran out about a month ago. Pt reports that he got into an altercation earlier today with a friend and he sts he realized that he needed to come in for medication.  Pt sts that his mother and GF both have told him that when he is taking his Lithium regularly he is like a different person.  Pt sts he has not been able to work fulltime recently due to his symptoms.  Pt sts he has been experiencing racing thoughts that have kept him distracted in his thinking. Pt sts that he is a regular user of cannabis and smokes cigarettes and occasionally a cigar.  Pt sts he drinks alcohol occasionally, about 1 x month.  Pt's UDS was positive for THC and his BAL was <5 tonight. Pt sts that he has been arrested in the past for drug possession but has no current pending charges and is not on probation. Pt sts he sleeps on average about 6 hours per night with some nights getting as little as 4 hours sleep.  Pt sts that he has been prescribed medications in the past to help him sleep but he sts he does not like "to pop pills for everything." Pt reports eating regularly and well with no recent weight loss. Pt denies symptoms of depression and anxiety despite a hx of both.  Pt sts he has panic attacks about 1-2 times per month or even less often at times. Pt did display a bit of irritability and reported an angry outburst, "cussing out a friend today."   Pt sts he lives with his mother and 3 younger siblings, 2 sisters and 1 brother.  Pt sts that he sees his father frequently as he lives nearby. Pt sts that he graduated high  school and attended 1 semester of college before stopping. 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 Previous diagnoses for pt are Bipolar Disorder, Seizures, Hypertension and chronic back pain. Pt sts he was diagnosed with Bipolar Disorder when he was 29 yo. Pt sts he has not been IP for MH reasons and has had OPT in the past from [facility] and [facility].  Pt sts he attended 5-6 sessions with Ester at [facility] and that it "helped him to talk."  Pt was dressed in street clothes and sitting on his hospital bed. Pt was alert, cooperative and pleasant. Pt kept good to fair eye contact, spoke in a clear tone and at a rapid, pressured pace. Pt moved in a normal manner when moving and moved restlessly throughout the assessment. Pt's thought process was coherent and relevant and judgement was partially impaired although he did display some key insights into his condition.  Pt's mood was not depressed, but he seemed a bit anxious and his blunted affect was congruent.  Pt was oriented x 4, to person, place, time and situation.   Diagnosis: Bipolar by hx  Past Medical History:  Past Medical History  Diagnosis Date  . Back pain   .  Hypertension   . Seizure (HCC)   . Bipolar disorder (manic depression) [facility])     Past Surgical History  Procedure Laterality Date  . Appendectomy      Family History: History reviewed. No pertinent family history.  Social History:  reports that he has been smoking Cigarettes.  He does not have any smokeless tobacco history on file. He reports that he drinks alcohol. He reports that he uses illicit drugs (Marijuana).  Additional Social History:  Alcohol / Drug Use Prescriptions: See PTA list History of alcohol / drug use?: Yes Longest period of sobriety (when/how long): "don't know" Substance #1 Name of Substance 1: Cannabis 1 - Age of First Use: 16 1 - Amount (size/oz):  "varies" 1 - Frequency: "varies...about 1 x week" 1 - Duration: years 1 - Last Use / Amount: This morning about 2-3 AM Substance #2 Name of Substance 2: Alcohol 2 - Age of First Use: 16 2 - Amount (size/oz): "a couple of beers to partying" 2 - Frequency: 1 x month 2 - Duration: ongoing 2 - Last Use / Amount: last week Substance #3 Name of Substance 3: Nicotine 3 - Age of First Use: 21 3 - Amount (size/oz): 1 pack per day when manic 3 - Frequency: when manic 3 - Duration: ongoing 3 - Last Use / Amount: today  CIWA: CIWA-Ar BP: 142/99 mmHg Pulse Rate: 86 COWS:    PATIENT STRENGTHS: (choose at least two) Ability for insight Average or above average intelligence Communication skills Supportive family/friends  Allergies:  Allergies  Allergen Reactions  . Penicillins Rash    Home Medications:  (Not in a hospital admission)  OB/GYN Status:  No LMP for male patient.  General Assessment Data Location of Assessment: [facility] TTS Assessment: In system Is this a Tele or Face-to-Face Assessment?: Tele Assessment Is this an Initial Assessment or a Re-assessment for this encounter?: Initial Assessment Marital status: Single Maiden name: na Is patient pregnant?: No Pregnancy Status: No Living Arrangements: Parent (lives with mother and 3 younger siblings) Can pt return to current living arrangement?: Yes Admission Status: Voluntary Is patient capable of signing voluntary admission?: Yes Referral Source: Self/Family/Friend Insurance type: None  Medical Screening Exam [facility] Walk-in ONLY) Medical Exam completed: Yes  Crisis Care Plan Living Arrangements: Parent (lives with mother and 3 younger siblings) Legal Guardian:  (self) Name of Psychiatrist: [facility] Name of Therapist: [facility]  Education Status Is patient currently in school?: No Current Grade: na Highest grade of school patient has completed: 12 (plus some college) Name of school: na Contact person:  na  Risk to self with the past 6 months Suicidal Ideation: No (denies strongly) Has patient been a risk to self within the past 6 months prior to admission? : No Suicidal Intent: No Has patient had any suicidal intent within the past 6 months prior to admission? : No Is patient at risk for suicide?: No Suicidal Plan?: No Has patient had any suicidal plan within the past 6 months prior to admission? : No Access to Means: No (denies) What has been your use of drugs/alcohol within the last 12 months?: regular Previous Attempts/Gestures: No How many times?: 0 Other Self Harm Risks: none noted Triggers for Past Attempts:  (na) Intentional Self Injurious Behavior: None Family Suicide History: No Recent stressful life event(s): Conflict (Comment), Financial Problems (off medications for over 1 month- manic periods) Persecutory voices/beliefs?: Yes Depression: No (denies symptoms) Depression Symptoms: Feeling angry/irritable Substance abuse history and/or treatment for substance  abuse?: Yes Suicide prevention information given to non-admitted patients: Not applicable  Risk to Others within the past 6 months Homicidal Ideation: No (denies strongly) Does patient have any lifetime risk of violence toward others beyond the six months prior to admission? : No Thoughts of Harm to Others: No Current Homicidal Intent: No Current Homicidal Plan: No Access to Homicidal Means: No (denies) Identified Victim: na History of harm to others?: No (denies) Assessment of Violence: None Noted Violent Behavior Description: na Does patient have access to weapons?: No (denies) Criminal Charges Pending?: No (denies) Does patient have a court date: No Is patient on probation?: No (denies)  Psychosis Hallucinations: None noted Delusions: None noted  Mental Status Report Appearance/Hygiene: Unremarkable (street clothes) Eye Contact: Good Motor Activity: Freedom of movement, Restlessness Speech:  Logical/coherent, Rapid, Pressured Level of Consciousness: Alert, Restless Mood: Anxious, Pleasant (manic/irritable symptoms) Affect: Anxious, Blunted, Irritable (slightly irritable- altercation earlier today) Anxiety Level: Minimal Thought Processes: Coherent, Relevant Judgement: Partial Orientation: Person, Place, Time, Situation Obsessive Compulsive Thoughts/Behaviors: None  Cognitive Functioning Concentration: Fair Memory: Recent Intact, Remote Intact IQ: Average Insight: Fair Impulse Control: Fair Appetite: Fair Weight Loss: 0 Weight Gain: 0 Sleep: No Change Total Hours of Sleep: 6 Vegetative Symptoms: None  ADLScreening [facility] Assessment Services) Patient's cognitive ability adequate to safely complete daily activities?: Yes Patient able to express need for assistance with ADLs?: Yes Independently performs ADLs?: Yes (appropriate for developmental age)  Prior Inpatient Therapy Prior Inpatient Therapy: No (denied) Prior Therapy Dates: na Prior Therapy Facilty/Provider(s): na Reason for Treatment: na  Prior Outpatient Therapy Prior Outpatient Therapy: Yes Prior Therapy Dates: years Prior Therapy Facilty/Provider(s): [facility] then, [facility] Reason for Treatment: Bipolar Does patient have an ACCT team?: No Does patient have Intensive In-House Services?  : No Does patient have [facility] services? : No Does patient have P4CC services?: No  ADL Screening (condition at time of admission) Patient's cognitive ability adequate to safely complete daily activities?: Yes Patient able to express need for assistance with ADLs?: Yes Independently performs ADLs?: Yes (appropriate for developmental age)       Abuse/Neglect Assessment (Assessment to be complete while patient is alone) Physical Abuse: Denies Verbal Abuse: Denies Sexual Abuse: Denies Exploitation of patient/patient's resources: Denies Self-Neglect: Denies     Merchant navy officerAdvance Directives (For Healthcare) Does patient  have an advance directive?: No Would patient like information on creating an advanced directive?: No - patient declined information    Additional Information 1:1 In Past 12 Months?: No CIRT Risk: No Elopement Risk: No Does patient have medical clearance?: Yes     Disposition:  Disposition Initial Assessment Completed for this Encounter: Yes Disposition of Patient: Other dispositions (Pending review w BHH Extender) Other disposition(s): Other (Comment)  Pending review with BHH Extender  Beryle FlockMary Edwardine Deschepper, MS, [facility], [facility] Triage Specialist [facility] Sheree Lalla T 10/13/2015 9:42 PM

## 2015-10-13 NOTE — ED Provider Notes (Addendum)
CSN: [id number]     Arrival date & time 10/13/15  1952 History  By signing my name below, I, Lyndel Safe, attest that this documentation has been prepared under the direction and in the presence of Glynn Octave, MD. Electronically Signed: Lyndel Safe, ED Scribe. 10/13/2015. 8:37 PM.   Chief Complaint  Patient presents with  . V70.1   The history is provided by the patient. No language interpreter was used.   HPI Comments: Dennis Valenzuela is a 29 y.o. male, with a PMhx of bipolar disorder, seizures, HTN, and back pain, who presents to the Emergency Department for medication refill. Pt states he needs his lithium  BID refilled which he takes for bipolar and has been out of for ~ 4-6 weeks. He admits to a verbal altercation that occurred earlier today prompting him to seek medical evaluation for Lithium refill. The pt has been taking Lithium for approximately 3-6 months for bipolar disorder. Additionally he takes Ativan and trazodone which he has also been out of. His medications are prescribed by a provider at [facility] in [place]. He notes a recent appetite change and states he is not eating at his baseline, however this is not his reason for this ED visit. He notes chronic lower back pain that is unchanged today. Denies SI/HI, auditory or visual hallucinations, illicit drug use, or EtOH use. Also denies fevers, chills, nausea, vomiting, diarrhea, abdominal pain, CP, or SOB. Pt has no other complaints.    Past Medical History  Diagnosis Date  . Back pain   . Hypertension   . Seizure (HCC)   . Bipolar disorder (manic depression) [facility])    Past Surgical History  Procedure Laterality Date  . Appendectomy     History reviewed. No pertinent family history. Social History  Substance Use Topics  . Smoking status: Current Every Day Smoker    Types: Cigarettes  . Smokeless tobacco: None  . Alcohol Use: Yes     Comment: rarely    Review of Systems  Constitutional:  Positive for appetite change. Negative for fever and chills.  HENT: Negative for congestion and sore throat.   Respiratory: Negative for shortness of breath.   Cardiovascular: Negative for chest pain.  Gastrointestinal: Negative for nausea, vomiting, abdominal pain and diarrhea.  Neurological: Negative for headaches.  Psychiatric/Behavioral: Negative for suicidal ideas, hallucinations, sleep disturbance, self-injury and dysphoric mood. The patient is not nervous/anxious.   10 Systems reviewed and all are negative for acute change except as noted in the HPI.  Allergies  Beef-derived products and Penicillins  Home Medications   Prior to Admission medications   Medication Sig Start Date End Date Taking? Authorizing Provider  acetaminophen (TYLENOL) 500 MG tablet Take 500 mg by mouth every 4 (four) hours as needed for pain.    Historical Provider, MD  lithium 300 MG tablet Take 1 tablet (300 mg total) by mouth 2 (two) times daily. 10/13/15   Glynn Octave, MD   BP 142/99 mmHg  Pulse 86  Temp(Src) 98.2 F (36.8 C) (Oral)  Resp 18  Ht  (1.88 m)  Wt 250 lb (113.399 kg)  BMI 32.08 kg/m2  SpO2 98% Physical Exam  Constitutional: He is oriented to person, place, and time. He appears well-developed and well-nourished. No distress.  Calm, cooperative  HENT:  Head: Normocephalic and atraumatic.  Mouth/Throat: Oropharynx is clear and moist. No oropharyngeal exudate.  Eyes: Conjunctivae and EOM are normal. Pupils are equal, round, and reactive to light. Right eye exhibits  no nystagmus. Left eye exhibits no nystagmus.  Neck: Normal range of motion. Neck supple.  No meningismus.  Cardiovascular: Normal rate, regular rhythm, normal heart sounds and intact distal pulses.   No murmur heard. Pulmonary/Chest: Effort normal and breath sounds normal. No respiratory distress.  Abdominal: Soft. There is no tenderness. There is no rebound and no guarding.  Musculoskeletal: Normal range of motion.  He exhibits no edema or tenderness.  Neurological: He is alert and oriented to person, place, and time. No cranial nerve deficit. He exhibits normal muscle tone. Coordination normal.  No tremors. No nystagmus.   Skin: Skin is warm.  Psychiatric: He has a normal mood and affect. His behavior is normal.  Nursing note and vitals reviewed.  ED Course  Procedures  DIAGNOSTIC STUDIES: Oxygen Saturation is 98% on RA, normal by my interpretation.    COORDINATION OF CARE: 8:17 PM Discussed treatment plan which includes to order diagnostic labs with pt. Pt acknowledges and agrees to plan.   Labs Review Labs Reviewed  LITHIUM LEVEL - Abnormal; Notable for the following:    Lithium Lvl <0.06 (*)    All other components within normal limits  CBC WITH DIFFERENTIAL/PLATELET - Abnormal; Notable for the following:    WBC 14.0 (*)    Hemoglobin 17.4 (*)    Neutro Abs 9.4 (*)    Monocytes Absolute 1.2 (*)    All other components within normal limits  URINE RAPID DRUG SCREEN, HOSP PERFORMED - Abnormal; Notable for the following:    Tetrahydrocannabinol POSITIVE (*)    All other components within normal limits  ETHANOL  BASIC METABOLIC PANEL   I have personally reviewed and evaluated these lab results as part of my medical decision-making.   MDM   Final diagnoses:  Bipolar affective disorder, currently manic, mild (HCC)   Patient states history of bipolar disorder out of lithium for the past one month. Requesting refill. Denies any suicidal or homicidal thoughts.Had a violent episode earlier today went to the police station and brought him here. Denies hearing any voices.   Drug screen positive for THC.  Lithium level undetectable.  Denies SI, HI, hallucinations. Does not meet inpatient criteria per TTS.  Will restart lithium. [facility]. Return precautions discussed.      I personally performed the services described in this documentation, which was scribed in my  presence. The recorded information has been reviewed and is accurate.   Glynn OctaveStephen Jamillia Closson, MD 10/13/15 [id number]  Glynn OctaveStephen Masaru Chamberlin, MD 10/14/15 [id number]

## 2015-10-13 NOTE — ED Notes (Signed)
Pt wondering how much longer he is going to have to wait.

## 2015-10-13 NOTE — Discharge Instructions (Signed)
Bipolar Disorder °Bipolar disorder is a mental illness. The term bipolar disorder actually is used to describe a group of disorders that all share varying degrees of emotional highs and lows that can interfere with daily functioning, such as work, school, or relationships. Bipolar disorder also can lead to drug abuse, hospitalization, and suicide. °The emotional highs of bipolar disorder are periods of elation or irritability and high energy. These highs can range from a mild form (hypomania) to a severe form (mania). People experiencing episodes of hypomania may appear energetic, excitable, and highly productive. People experiencing mania may behave impulsively or erratically. They often make poor decisions. They may have difficulty sleeping. The most severe episodes of mania can involve having very distorted beliefs or perceptions about the world and seeing or hearing things that are not real (psychotic delusions and hallucinations).  °The emotional lows of bipolar disorder (depression) also can range from mild to severe. Severe episodes of bipolar depression can involve psychotic delusions and hallucinations. °Sometimes people with bipolar disorder experience a state of mixed mood. Symptoms of hypomania or mania and depression are both present during this mixed-mood episode. °SIGNS AND SYMPTOMS °There are signs and symptoms of the episodes of hypomania and mania as well as the episodes of depression. The signs and symptoms of hypomania and mania are similar but vary in severity. They include: °· Inflated self-esteem or feeling of increased self-confidence. °· Decreased need for sleep. °· Unusual talkativeness (rapid or pressured speech) or the feeling of a need to keep talking. °· Sensation of racing thoughts or constant talking, with quick shifts between topics that may or may not be related (flight of ideas). °· Decreased ability to focus or concentrate. °· Increased purposeful activity, such as work, studies,  or social activity, or nonproductive activity, such as pacing, squirming and fidgeting, or finger and toe tapping. °· Impulsive behavior and use of poor judgment, resulting in high-risk activities, such as having unprotected sex or spending excessive amounts of money. °Signs and symptoms of depression include the following:  °· Feelings of sadness, hopelessness, or helplessness. °· Frequent or uncontrollable episodes of crying. °· Lack of feeling anything or caring about anything. °· Difficulty sleeping or sleeping too much.  °· Inability to enjoy the things you used to enjoy.   °· Desire to be alone all the time.   °· Feelings of guilt or worthlessness.  °· Lack of energy or motivation.   °· Difficulty concentrating, remembering, or making decisions.  °· Change in appetite or weight beyond normal fluctuations. °· Thoughts of death or the desire to harm yourself. °DIAGNOSIS  °Bipolar disorder is diagnosed through an assessment by your caregiver. Your caregiver will ask questions about your emotional episodes. There are two main types of bipolar disorder. People with type I bipolar disorder have manic episodes with or without depressive episodes. People with type II bipolar disorder have hypomanic episodes and major depressive episodes, which are more serious than mild depression. The type of bipolar disorder you have can make an important difference in how your illness is monitored and treated. °Your caregiver may ask questions about your medical history and use of alcohol or drugs, including prescription medication. Certain medical conditions and substances also can cause emotional highs and lows that resemble bipolar disorder (secondary bipolar disorder).  °TREATMENT  °Bipolar disorder is a long-term illness. It is best controlled with continuous treatment rather than treatment only when symptoms occur. The following treatments can be prescribed for bipolar disorders: °· Medication--Medication can be prescribed by  a doctor that   is an expert in treating mental disorders (psychiatrists). Medications called mood stabilizers are usually prescribed to help control the illness. Other medications are sometimes added if symptoms of mania, depression, or psychotic delusions and hallucinations occur despite the use of a mood stabilizer. °· Talk therapy--Some forms of talk therapy are helpful in providing support, education, and guidance. °A combination of medication and talk therapy is best for managing the disorder over time. A procedure in which electricity is applied to your brain through your scalp (electroconvulsive therapy) is used in cases of severe mania when medication and talk therapy do not work or work too slowly. °  °This information is not intended to replace advice given to you by your health care provider. Make sure you discuss any questions you have with your health care provider. °  °Document Released: 01/14/2001 Document Revised: 10/29/2014 Document Reviewed: 11/03/2012 °Elsevier Interactive Patient Education ©2016 [employer] Inc. ° °

## 2015-10-13 NOTE — ED Notes (Signed)
TTS set up in the room.

## 2015-10-13 NOTE — BHH Counselor (Signed)
Received call from Dr. Manus Gunningancour, EDP at [facility].  He advised that he had read my assessment note and saw my notation that I had not staffed the case with the [facility] yet because she was attending to another pt. Dr. Manus Gunningancour reported that the pt was anxious to leave and the pt was able to be overheard in the background during the call. Dr. Manus Gunningancour asked if this writer believed that the pt was dangerous or if IP would be recommended.  This Clinical research associatewriter answered that per my assessment pt did not appear to meet IP criteria but that I could not be certain of the recommendation until I staffed the case with the [facility]. EDP stated he understood.   Beryle FlockMary Krishav Mamone, MS, CRC, [facility] Triage Specialist [facility]

## 2015-10-13 NOTE — ED Notes (Addendum)
Pt denies SI or HI, requesting Lithium level to be checked and having racing thoughts, pt states that he had cussed out his friend earlier today, states he takes Lithium for bipolar

## 2015-10-13 NOTE — BH Assessment (Signed)
Per Hulan FessIjeoma Nwaeze, NP: Does not meet IP criteria. Recommend D/C with OP resources    Beryle FlockMary Suvan Stcyr, MS, CRC, [facility] Triage Specialist [facility]

## 2015-10-14 ENCOUNTER — Telehealth: Payer: Self-pay | Admitting: *Deleted

## 2015-10-14 NOTE — Telephone Encounter (Signed)
Pharmacy called related to Rx: lithium 300 MG tablet .Marland Kitchen.Marland Kitchen.NCM clarified with EDP to change Rx to: capsules.

## 2016-02-07 ENCOUNTER — Encounter (HOSPITAL_COMMUNITY): Payer: Self-pay

## 2016-02-07 ENCOUNTER — Emergency Department (HOSPITAL_COMMUNITY): Payer: Self-pay

## 2016-02-07 ENCOUNTER — Emergency Department (HOSPITAL_COMMUNITY)
Admission: EM | Admit: 2016-02-07 | Discharge: 2016-02-07 | Disposition: A | Discharge status: Other Acute Inpatient Hospital | Payer: Self-pay | Attending: Emergency Medicine | Admitting: Emergency Medicine

## 2016-02-07 ENCOUNTER — Observation Stay (HOSPITAL_COMMUNITY)
Admission: AD | Admit: 2016-02-07 | Discharge: 2016-02-08 | Disposition: A | Discharge status: Home / Self Care | Payer: Self-pay | Source: Intra-hospital | Attending: Psychiatry | Admitting: Psychiatry

## 2016-02-07 DIAGNOSIS — R569 Unspecified convulsions: Secondary | ICD-10-CM | POA: Insufficient documentation

## 2016-02-07 DIAGNOSIS — F419 Anxiety disorder, unspecified: Secondary | ICD-10-CM | POA: Insufficient documentation

## 2016-02-07 DIAGNOSIS — R45851 Suicidal ideations: Secondary | ICD-10-CM | POA: Insufficient documentation

## 2016-02-07 DIAGNOSIS — Z880 Allergy status to penicillin: Secondary | ICD-10-CM | POA: Insufficient documentation

## 2016-02-07 DIAGNOSIS — F17210 Nicotine dependence, cigarettes, uncomplicated: Secondary | ICD-10-CM | POA: Insufficient documentation

## 2016-02-07 DIAGNOSIS — R002 Palpitations: Secondary | ICD-10-CM | POA: Insufficient documentation

## 2016-02-07 DIAGNOSIS — R197 Diarrhea, unspecified: Secondary | ICD-10-CM | POA: Insufficient documentation

## 2016-02-07 DIAGNOSIS — F319 Bipolar disorder, unspecified: Secondary | ICD-10-CM | POA: Insufficient documentation

## 2016-02-07 DIAGNOSIS — I10 Essential (primary) hypertension: Secondary | ICD-10-CM | POA: Insufficient documentation

## 2016-02-07 DIAGNOSIS — F1210 Cannabis abuse, uncomplicated: Secondary | ICD-10-CM | POA: Insufficient documentation

## 2016-02-07 DIAGNOSIS — R51 Headache: Secondary | ICD-10-CM | POA: Insufficient documentation

## 2016-02-07 DIAGNOSIS — F329 Major depressive disorder, single episode, unspecified: Secondary | ICD-10-CM | POA: Diagnosis present

## 2016-02-07 DIAGNOSIS — F3130 Bipolar disorder, current episode depressed, mild or moderate severity, unspecified: Principal | ICD-10-CM | POA: Diagnosis present

## 2016-02-07 LAB — CBC WITH DIFFERENTIAL/PLATELET
Basophils Absolute: 0 10*3/uL (ref 0.0–0.1)
Basophils Relative: 0 %
EOS ABS: 0.1 10*3/uL (ref 0.0–0.7)
EOS PCT: 0 %
HCT: 47 % (ref 39.0–52.0)
Hemoglobin: 16.1 g/dL (ref 13.0–17.0)
LYMPHS ABS: 1.6 10*3/uL (ref 0.7–4.0)
Lymphocytes Relative: 10 %
MCH: 30.6 pg (ref 26.0–34.0)
MCHC: 34.3 g/dL (ref 30.0–36.0)
MCV: 89.2 fL (ref 78.0–100.0)
MONO ABS: 0.9 10*3/uL (ref 0.1–1.0)
MONOS PCT: 6 %
NEUTROS ABS: 14 10*3/uL — AB (ref 1.7–7.7)
Neutrophils Relative %: 84 %
PLATELETS: 189 10*3/uL (ref 150–400)
RBC: 5.27 MIL/uL (ref 4.22–5.81)
RDW: 12.2 % (ref 11.5–15.5)
WBC: 16.6 10*3/uL — ABNORMAL HIGH (ref 4.0–10.5)

## 2016-02-07 LAB — HEPATIC FUNCTION PANEL
ALBUMIN: 3.7 g/dL (ref 3.5–5.0)
ALT: 245 U/L — ABNORMAL HIGH (ref 17–63)
AST: 102 U/L — AB (ref 15–41)
Alkaline Phosphatase: 66 U/L (ref 38–126)
BILIRUBIN DIRECT: 0.2 mg/dL (ref 0.1–0.5)
Indirect Bilirubin: 0.8 mg/dL (ref 0.3–0.9)
Total Bilirubin: 1 mg/dL (ref 0.3–1.2)
Total Protein: 6.5 g/dL (ref 6.5–8.1)

## 2016-02-07 LAB — BASIC METABOLIC PANEL
ANION GAP: 9 (ref 5–15)
BUN: 15 mg/dL (ref 6–20)
CALCIUM: 8.4 mg/dL — AB (ref 8.9–10.3)
CHLORIDE: 107 mmol/L (ref 101–111)
CO2: 27 mmol/L (ref 22–32)
Creatinine, Ser: 1.19 mg/dL (ref 0.61–1.24)
GFR calc Af Amer: >60 mL/min (ref >60)
GFR calc non Af Amer: >60 mL/min (ref >60)
GLUCOSE: 142 mg/dL — AB (ref 65–99)
Potassium: 3.2 mmol/L — ABNORMAL LOW (ref 3.5–5.1)
Sodium: 143 mmol/L (ref 135–145)

## 2016-02-07 LAB — RAPID URINE DRUG SCREEN, HOSP PERFORMED
Amphetamines: NOT DETECTED
BARBITURATES: NOT DETECTED
BENZODIAZEPINES: NOT DETECTED
Cocaine: NOT DETECTED
Opiates: NOT DETECTED
Tetrahydrocannabinol: POSITIVE — AB

## 2016-02-07 LAB — ETHANOL: Alcohol, Ethyl (B): <5 mg/dL (ref <5)

## 2016-02-07 LAB — LITHIUM LEVEL: Lithium Lvl: <0.06 mmol/L — ABNORMAL LOW (ref 0.60–1.20)

## 2016-02-07 LAB — ACETAMINOPHEN LEVEL

## 2016-02-07 LAB — SALICYLATE LEVEL

## 2016-02-07 MED ORDER — LITHIUM CARBONATE ER 300 MG PO TBCR
300.0000 mg | EXTENDED_RELEASE_TABLET | Freq: Two times a day (BID) | ORAL | Status: DC
  Administered 2016-02-07 – 2016-02-08 (×2): 300 mg via ORAL
  Filled 2016-02-07 (×2): qty 1

## 2016-02-07 MED ORDER — ONDANSETRON HCL 4 MG PO TABS
4.0000 mg | ORAL_TABLET | Freq: Three times a day (TID) | ORAL | Status: DC | PRN
  Administered 2016-02-07: 4 mg via ORAL
  Filled 2016-02-07: qty 1

## 2016-02-07 MED ORDER — ALUM & MAG HYDROXIDE-SIMETH 200-200-20 MG/5ML PO SUSP: 30.0000 mL | ORAL | Status: DC | PRN

## 2016-02-07 MED ORDER — POTASSIUM CHLORIDE CRYS ER 20 MEQ PO TBCR
40.0000 meq | EXTENDED_RELEASE_TABLET | Freq: Once | ORAL | Status: DC
  Filled 2016-02-07: qty 2

## 2016-02-07 MED ORDER — LORAZEPAM 1 MG PO TABS
1.0000 mg | ORAL_TABLET | Freq: Four times a day (QID) | ORAL | Status: DC | PRN
  Administered 2016-02-07: 1 mg via ORAL
  Filled 2016-02-07: qty 1

## 2016-02-07 MED ORDER — LITHIUM CARBONATE 300 MG PO TABS
300.0000 mg | ORAL_TABLET | Freq: Two times a day (BID) | ORAL | Status: DC
  Filled 2016-02-07 (×3): qty 1

## 2016-02-07 MED ORDER — LITHIUM CARBONATE 300 MG PO CAPS
300.0000 mg | ORAL_CAPSULE | Freq: Two times a day (BID) | ORAL | Status: DC
  Administered 2016-02-07: 300 mg via ORAL
  Filled 2016-02-07: qty 1

## 2016-02-07 MED ORDER — MAGNESIUM HYDROXIDE 400 MG/5ML PO SUSP: 30.0000 mL | Freq: Every day | ORAL | Status: DC | PRN

## 2016-02-07 MED ORDER — HYDROXYZINE HCL 25 MG PO TABS
25.0000 mg | ORAL_TABLET | Freq: Four times a day (QID) | ORAL | Status: DC | PRN
Start: 2016-02-07 — End: 2016-02-08
  Administered 2016-02-08: 25 mg via ORAL
  Filled 2016-02-07: qty 1

## 2016-02-07 MED ORDER — TRAZODONE HCL 50 MG PO TABS
50.0000 mg | ORAL_TABLET | Freq: Every evening | ORAL | Status: DC | PRN
  Administered 2016-02-07: 50 mg via ORAL
  Filled 2016-02-07: qty 1

## 2016-02-07 MED ORDER — ACETAMINOPHEN 325 MG PO TABS: 650.0000 mg | ORAL_TABLET | Freq: Four times a day (QID) | ORAL | Status: DC | PRN

## 2016-02-07 NOTE — ED Provider Notes (Signed)
CSN: [id number]     Arrival date & time 02/07/16  [id number] History   First MD Initiated Contact with Patient 02/07/16 [phone]     Chief Complaint  Patient presents with  . V70.1   (Consider location/radiation/quality/duration/timing/severity/associated sxs/prior Treatment) Patient is a 30 y.o. male presenting with headaches.  Headache Associated symptoms: back pain and diarrhea   Associated symptoms: no abdominal pain and no congestion   30 y/o man with Hx of bipolar disorder presenting with heart palpitations, anxiety and depression, and headache ongoing since waking up this morning. He reports his headache is pressure-like, diffuse, bilateral, and continuous. Overall he is more concerned about his palpitations than the headache by time of evaluation here. This is associated with a high level of anxiety. He was feeling in good health last night but has been feeling increasingly depressed for the past few weeks to months. He reports not taking lithium as prescribed for several months due to not following up with any physicians as had been recommended.  On further questioning he does endorse current suicidal ideation. He denies any attempts during these past few months of depression. He does not have a specific plan in mind at this time.  Past Medical History  Diagnosis Date  . Back pain   . Hypertension   . Seizure (HCC)   . Bipolar disorder (manic depression) [facility](HCC)    Past Surgical History  Procedure Laterality Date  . Appendectomy     No family history on file. Social History  Substance Use Topics  . Smoking status: Current Every Day Smoker    Types: Cigarettes  . Smokeless tobacco: None  . Alcohol Use: Yes     Comment: rarely    Review of Systems  Constitutional: Negative for appetite change.  HENT: Negative for congestion.   Eyes: Negative for visual disturbance.  Respiratory: Positive for chest tightness and shortness of breath.   Cardiovascular: Positive for palpitations.  Negative for chest pain.  Gastrointestinal: Positive for diarrhea. Negative for abdominal pain.  Genitourinary: Negative for difficulty urinating.  Musculoskeletal: Positive for back pain.  Skin: Negative for color change.  Neurological: Positive for headaches. Negative for syncope.  Psychiatric/Behavioral: Positive for suicidal ideas, dysphoric mood and agitation.      Allergies  Beef-derived products and Penicillins  Home Medications   Prior to Admission medications   Medication Sig Start Date End Date Taking? Authorizing Provider  lithium 300 MG tablet Take 1 tablet (300 mg total) by mouth 2 (two) times daily. Patient not taking: Reported on 02/07/2016 10/13/15   Glynn OctaveStephen Rancour, MD   BP 126/83 mmHg  Pulse 87  Temp(Src) 97.6 F (36.4 C) (Oral)  Resp 20  Ht 6\' 2"  (1.88 m)  Wt 117.935 kg  BMI 33.37 kg/m2  SpO2 96% Physical Exam  Constitutional: He is oriented to person, place, and time. He appears well-developed and well-nourished.  HENT:  Head: Normocephalic and atraumatic.  Mouth/Throat: Oropharynx is clear and moist.  Eyes: EOM are normal. Pupils are equal, round, and reactive to light.  Neck: Normal range of motion. Neck supple.  Cardiovascular: Normal rate and regular rhythm.   Pulmonary/Chest:  Shallow breathing  Abdominal: Soft. He exhibits no distension. There is no tenderness.  Musculoskeletal: He exhibits no edema.  Neurological: He is alert and oriented to person, place, and time.  Skin: Skin is warm and dry. He is not diaphoretic.  Psychiatric:  Mood depressed, suicidal ideation present, anxious and some trembling partially improving with distraction  ED Course  Procedures (including critical care time) Labs Review Labs Reviewed  BASIC METABOLIC PANEL - Abnormal; Notable for the following:    Potassium 3.2 (*)    Glucose, Bld 142 (*)    Calcium 8.4 (*)    All other components within normal limits  CBC WITH DIFFERENTIAL/PLATELET - Abnormal;  Notable for the following:    WBC 16.6 (*)    Neutro Abs 14.0 (*)    All other components within normal limits  ACETAMINOPHEN LEVEL - Abnormal; Notable for the following:    Acetaminophen (Tylenol), Serum <10 (*)    All other components within normal limits  HEPATIC FUNCTION PANEL - Abnormal; Notable for the following:    AST 102 (*)    ALT 245 (*)    All other components within normal limits  LITHIUM LEVEL - Abnormal; Notable for the following:    Lithium Lvl <0.06 (*)    All other components within normal limits  ETHANOL  SALICYLATE LEVEL  URINE RAPID DRUG SCREEN, HOSP PERFORMED    Imaging Review Dg Chest 2 View  02/07/2016  CLINICAL DATA:  Shortness of breath and chest tightness for several days EXAM: CHEST  2 VIEW COMPARISON:  Feb 21, 2012. FINDINGS: Lungs are clear. Heart size and pulmonary vascularity are normal. No adenopathy. No bone lesions. No pneumothorax. IMPRESSION: No edema or consolidation. Electronically Signed   By: Bretta Bang III M.D.   On: 02/07/2016 10:41   I have personally reviewed and evaluated these images and lab results as part of my medical decision-making.   EKG Interpretation   Date/Time:  Tuesday February 07 2016 09:22:44 EDT Ventricular Rate:  80 PR Interval:  149 QRS Duration: 95 QT Interval:  358 QTC Calculation: 413 R Axis:   104 Text Interpretation:  Sinus rhythm Borderline right axis deviation Low  voltage, precordial leads Nonspecific T abnormalities, diffuse leads No  previous ECGs available Confirmed by Bebe Shaggy  MD, DONALD ([id number]) on  02/07/2016 9:45:48 AM      MDM   Final diagnoses:  Suicidal ideation  Palpitations   Patient presented with complaints of headache, palpitations, and anxiety. He has bipolar disorder and reports being off his prescribed lithium for a few months. He has no neurological deficits and headache is diffuse, achy, and there are no sensory changes. EKG demonstrated a regular rhythm. His symptoms may be  explained by a panic attack or paroxysmal arrhythmia but is recovered in the ED. This is appropriate for further outpatient workup as these events are chronic and episodic from his history. VSS and he ambulated with SpO2>95%.  While in the ED he reports ongoing suicidal ideation without suicide attempt. Psychiatry consulted since this is his main health problem at this time. He is amenable to psychiatric evaluation and treatment plan.   Fuller Plan, MD 02/07/16 1204  Zadie Rhine, MD 02/07/16 1452

## 2016-02-07 NOTE — ED Notes (Signed)
Patient ambulated in hallway.  O2 sat maintained at 95% - 97%

## 2016-02-07 NOTE — ED Notes (Signed)
Pt states he is suppose to be on lithium but he ran out a few months ago. Pt states he passes out sometimes when he has these " episodes " but he didn't this time

## 2016-02-07 NOTE — ED Notes (Signed)
Case Management should be down about 1045.

## 2016-02-07 NOTE — Progress Notes (Signed)
Alford HighlandDempsey "DJ" Renae GlossShelton is in bed and watching TV.  PT denies any pain, SI, HI or AVH. Pt is quiet and cooperative.  Pt sts he is hungry and would like some snacks. Pt given snacks and soda. Pt watching TV with no complaints. Pt verbally contracts for safety.  Pt continuously observed fOr safety while on the unit except when in the bathroom.

## 2016-02-07 NOTE — H&P (Signed)
[facility] Admission Assessment Adult  Patient Identification: Dennis Valenzuela MRN:  [id number] Date of Evaluation:  02/07/2016 Chief Complaint:  Patient states "I have been cycling again."  Principal Diagnosis: Bipolar I disorder, most recent episode depressed ([place]) Diagnosis:   Patient Active Problem List   Diagnosis Date Noted  . Bipolar I disorder, most recent episode depressed ([place]) [F31.30] 02/07/2016   History of Present Illness::   Dennis Valenzuela is a 30 year old male who presented to the [facility] reporting suicidal ideation, anxiety, palpitations and desire to be restarted on his psychiatric medication. He was medically cleared after complaining of headache and diarrhea. Patient reports increased feelings of depression since he stopped taking psychiatric medication (Lithium) in January 2017. Patient reports that he was previously receiving services with [facility] but did not follow up as directed and ran out of Lithium.  Patient denies prior suicide attempts. Patient denies HI/Substance Abuse/Psychosis. Patient denies physical, sexual or emotional abuse.Patient reports that he lives with his mother and he has been unemployed for the past year.The patient states "I have periods when I don't need much sleep for a month. But I still have lots of energy. My thoughts race around. Then I crash with the depression. I got so scared yesterday because I had a strong urge to cut my throat with a knife in the kitchen. But I love myself.  I have been feeling this way for two weeks now. I am very depressed. When I think back I did good on the Lithium with my mood being controlled. It was also affordable and I do not have any insurance. In the past Depakote and Zyprexa made me gain weight. I know I need to do better about follow up. I wonder if there is a place closer to where I live. I am still having these thoughts of hurting myself but I feel safe here. I was diagnosed with Bipolar at age  53 when I was at [facility]. I would like to try the Lithium again. I did not have any side effects in the past when I took it." Patient was very cooperative with the interview today appearing depressed throughout. He continues to have suicidal thoughts but is able to contract for safety. The patient is concerned about the severe nature of his thoughts stating "I have thought about it before but never had a plan like this." His alcohol level is negative and urine drug screen is positive for marijuana. Patient denies alcohol abuse but his AST and ALT are noted to be elevated on his chemistry panel. His vitals are currently stable with no evidence of acute withdrawal symptoms.   Associated Signs/Symptoms: Depression Symptoms:  depressed mood, anhedonia, insomnia, psychomotor retardation, feelings of worthlessness/guilt, difficulty concentrating, hopelessness, recurrent thoughts of death, suicidal thoughts with specific plan, anxiety, panic attacks, loss of energy/fatigue, disturbed sleep, (Hypo) Manic Symptoms:  Elevated Mood, Flight of Ideas, Impulsivity, Irritable Mood, Labiality of Mood, Patient reports has experienced these symptoms during past manic episodes.  Anxiety Symptoms:  Panic Symptoms, Psychotic Symptoms:  Denies PTSD Symptoms: Negative Total Time spent with patient: 45 minutes  Past Psychiatric History: Bipolar 1  Is the patient at risk to self? Yes.    Has the patient been a risk to self in the past 6 months? Yes.    Has the patient been a risk to self within the distant past? No.  Is the patient a risk to others? No.  Has the patient been a risk to others in  the past 6 months? No.  Has the patient been a risk to others within the distant past? No.   Prior Inpatient Therapy:  [facility] at age 83  Prior Outpatient Therapy:   [facility]   Alcohol Screening:   Substance Abuse History in the last 12 months:  Yes.   His urine drug screen is positive for marijuana.   Consequences of Substance Abuse: Possible negative effects of marijuana on his mental health.  Previous Psychotropic Medications: Yes, Lithium, Zyprexa, Depakote  Psychological Evaluations: No  Past Medical History:  Past Medical History  Diagnosis Date  . Back pain   . Hypertension   . Seizure ([place])   . Bipolar disorder (manic depression) [facility])     Past Surgical History  Procedure Laterality Date  . Appendectomy     Family History: No family history on file. Family Psychiatric  History: Patient denies Tobacco Screening: Denies Social History:  History  Alcohol Use  . Yes    Comment: rarely     History  Drug Use  . Yes  . Special: Marijuana    Additional Social History:                           Allergies:   Allergies  Allergen Reactions  . Beef-Derived Products Other (See Comments)    unknown  . Penicillins Rash    Has patient had a PCN reaction causing immediate rash, facial/tongue/throat swelling, SOB or lightheadedness with hypotension: Yes Has patient had a PCN reaction causing severe rash involving mucus membranes or skin necrosis: No Has patient had a PCN reaction that required hospitalization No Has patient had a PCN reaction occurring within the last 10 years: No If all of the above answers are "NO", then may proceed with Cephalosporin use.    Lab Results:  Results for orders placed or performed during the hospital encounter of 02/07/16 (from the past 48 hour(s))  Basic metabolic panel     Status: Abnormal   Collection Time: 02/07/16  9:54 AM  Result Value Ref Range   Sodium 143 135 - 145 mmol/L   Potassium 3.2 (L) 3.5 - 5.1 mmol/L   Chloride 107 101 - 111 mmol/L   CO2 27 22 - 32 mmol/L   Glucose, Bld 142 (H) 65 - 99 mg/dL   BUN 15 6 - 20 mg/dL   Creatinine, Ser 1.19 0.61 - 1.24 mg/dL   Calcium 8.4 (L) 8.9 - 10.3 mg/dL   GFR calc non Af Amer >60 >60 mL/min   GFR calc Af Amer >60 >60 mL/min    Comment: (NOTE) The eGFR has been  calculated using the CKD EPI equation. This calculation has not been validated in all clinical situations. eGFR's persistently <60 mL/min signify possible Chronic Kidney Disease.    Anion gap 9 5 - 15  CBC with Differential     Status: Abnormal   Collection Time: 02/07/16  9:54 AM  Result Value Ref Range   WBC 16.6 (H) 4.0 - 10.5 K/uL   RBC 5.27 4.22 - 5.81 MIL/uL   Hemoglobin 16.1 13.0 - 17.0 g/dL   HCT 47.0 39.0 - 52.0 %   MCV 89.2 78.0 - 100.0 fL   MCH 30.6 26.0 - 34.0 pg   MCHC 34.3 30.0 - 36.0 g/dL   RDW 12.2 11.5 - 15.5 %   Platelets 189 150 - 400 K/uL   Neutrophils Relative % 84 %   Neutro Abs 14.0 (H)  1.7 - 7.7 K/uL   Lymphocytes Relative 10 %   Lymphs Abs 1.6 0.7 - 4.0 K/uL   Monocytes Relative 6 %   Monocytes Absolute 0.9 0.1 - 1.0 K/uL   Eosinophils Relative 0 %   Eosinophils Absolute 0.1 0.0 - 0.7 K/uL   Basophils Relative 0 %   Basophils Absolute 0.0 0.0 - 0.1 K/uL  Acetaminophen level     Status: Abnormal   Collection Time: 02/07/16  9:54 AM  Result Value Ref Range   Acetaminophen (Tylenol), Serum <10 (L) 10 - 30 ug/mL    Comment:        THERAPEUTIC CONCENTRATIONS VARY SIGNIFICANTLY. A RANGE OF 10-30 ug/mL MAY BE AN EFFECTIVE CONCENTRATION FOR MANY PATIENTS. HOWEVER, SOME ARE BEST TREATED AT CONCENTRATIONS OUTSIDE THIS RANGE. ACETAMINOPHEN CONCENTRATIONS >150 ug/mL AT 4 HOURS AFTER INGESTION AND >50 ug/mL AT 12 HOURS AFTER INGESTION ARE OFTEN ASSOCIATED WITH TOXIC REACTIONS.   Ethanol     Status: None   Collection Time: 02/07/16  9:54 AM  Result Value Ref Range   Alcohol, Ethyl (B) <5 <5 mg/dL    Comment:        LOWEST DETECTABLE LIMIT FOR SERUM ALCOHOL IS 5 mg/dL FOR MEDICAL PURPOSES ONLY   Salicylate level     Status: None   Collection Time: 02/07/16  9:54 AM  Result Value Ref Range   Salicylate Lvl <5.4 2.8 - 30.0 mg/dL  Lithium level     Status: Abnormal   Collection Time: 02/07/16  9:54 AM  Result Value Ref Range   Lithium Lvl <0.06  (L) 0.60 - 1.20 mmol/L  Hepatic function panel     Status: Abnormal   Collection Time: 02/07/16  9:56 AM  Result Value Ref Range   Total Protein 6.5 6.5 - 8.1 g/dL   Albumin 3.7 3.5 - 5.0 g/dL   AST 102 (H) 15 - 41 U/L   ALT 245 (H) 17 - 63 U/L   Alkaline Phosphatase 66 38 - 126 U/L   Total Bilirubin 1.0 0.3 - 1.2 mg/dL   Bilirubin, Direct 0.2 0.1 - 0.5 mg/dL   Indirect Bilirubin 0.8 0.3 - 0.9 mg/dL  Urine rapid drug screen (hosp performed)     Status: Abnormal   Collection Time: 02/07/16  1:58 PM  Result Value Ref Range   Opiates NONE DETECTED NONE DETECTED   Cocaine NONE DETECTED NONE DETECTED   Benzodiazepines NONE DETECTED NONE DETECTED   Amphetamines NONE DETECTED NONE DETECTED   Tetrahydrocannabinol POSITIVE (A) NONE DETECTED   Barbiturates NONE DETECTED NONE DETECTED    Comment:        DRUG SCREEN FOR MEDICAL PURPOSES ONLY.  IF CONFIRMATION IS NEEDED FOR ANY PURPOSE, NOTIFY LAB WITHIN 5 DAYS.        LOWEST DETECTABLE LIMITS FOR URINE DRUG SCREEN Drug Class       Cutoff (ng/mL) Amphetamine      1000 Barbiturate      200 Benzodiazepine   492 Tricyclics       010 Opiates          300 Cocaine          300 THC              50     Blood Alcohol level:  Lab Results  Component Value Date   ETH <5 02/07/2016   ETH <5 05/02/1974    Metabolic Disorder Labs:  No results found for: HGBA1C, MPG No results found for: PROLACTIN No results  found for: CHOL, TRIG, HDL, CHOLHDL, VLDL, LDLCALC  Current Medications: Current Facility-Administered Medications  Medication Dose Route Frequency Provider Last Rate Last Dose  . acetaminophen (TYLENOL) tablet 650 mg  650 mg Oral Q6H PRN Niel Hummer, NP      . alum & mag hydroxide-simeth (MAALOX/MYLANTA) 200-200-20 MG/5ML suspension 30 mL  30 mL Oral Q4H PRN Niel Hummer, NP      . hydrOXYzine (ATARAX/VISTARIL) tablet 25 mg  25 mg Oral Q6H PRN Niel Hummer, NP      . lithium carbonate (LITHOBID) CR tablet 300 mg  300 mg Oral  Q12H Niel Hummer, NP      . magnesium hydroxide (MILK OF MAGNESIA) suspension 30 mL  30 mL Oral Daily PRN Niel Hummer, NP      . traZODone (DESYREL) tablet 50 mg  50 mg Oral QHS PRN Niel Hummer, NP       PTA Medications: Prescriptions prior to admission  Medication Sig Dispense Refill Last Dose  . lithium 300 MG tablet Take 1 tablet (300 mg total) by mouth 2 (two) times daily. (Patient not taking: Reported on 02/07/2016) 60 tablet 0     Musculoskeletal: Strength & Muscle Tone: within normal limits Gait & Station: normal Patient leans: N/A  Psychiatric Specialty Exam: Physical Exam  ROS  There were no vitals taken for this visit.There is no weight on file to calculate BMI.  General Appearance: Casual  Eye Contact::  Good  Speech:  Clear and Coherent  Volume:  Normal  Mood:  Depressed  Affect:  Constricted  Thought Process:  Goal Directed and Intact  Orientation:  Full (Time, Place, and Person)  Thought Content:  Psychiatric symptoms   Suicidal Thoughts:  Yes.  with intent/plan  Homicidal Thoughts:  No  Memory:  Immediate;   Good Recent;   Good Remote;   Good  Judgement:  Fair  Insight:  Present  Psychomotor Activity:  Decreased  Concentration:  Good  Recall:  Good  Fund of Knowledge:Good  Language: Good  Akathisia:  No  Handed:  Right  AIMS (if indicated):     Assets:  Communication Skills Desire for Improvement Housing Intimacy Leisure Time Physical Health Resilience Social Support Talents/Skills  ADL's:  Intact  Cognition: WNL  Sleep:        Treatment Plan Summary: Daily contact with patient to assess and evaluate symptoms and progress in treatment and Medication management  Observation Level/Precautions:  Continuous Observation  Laboratory:  CBC Chemistry Profile UDS TSH  Order TSH/free T4 as baseline due to Lithium, Hepatitis panel for elevated liver enzymes, basic metabolic panel to recheck his low potassium level   Psychotherapy:  Individual    Medications:  Start Lithium 300 mg bid for mood control, vistaril prn for anxiety, and Trazodone prn for insomnia   Consultations:  As needed  Discharge Concerns:  Mood stability, continuing his psychiatric treatment   Estimated LOS: 24-48 hours  Other:  Consider inpatient admission tomorrow if patient continues to endorse suicidal ideation     Aydin Hink, Mickel Baas, NP 4/18/20176:25 PM

## 2016-02-07 NOTE — Progress Notes (Signed)
[facility] by L. Earlene Plateravis, NP, attending MD Dr. Lucianne MussKumar.   Ilean SkillMeghan Mattea Seger, MSW, LCSW Clinical Social Work, Disposition  02/07/2016 [phone]

## 2016-02-07 NOTE — ED Provider Notes (Signed)
The patient appears reasonably stabilized for transfer considering the current resources, flow, and capabilities available in the ED at this time, and I doubt any other [facility] and/or treatment in the ED prior to transfer.   Zadie Rhineonald Khai Arrona, MD 02/07/16 1320

## 2016-02-07 NOTE — Progress Notes (Signed)
Nursing Admission Note  [facility] admission from [facility] with diagnosis of MDD. Patient reports a history of having been diagnosed as Bipolar at age 30. Several months ago patient stopped taking his medication and then became very manic with reported atypical risky behaviors, etc. In January he reports he went back on his meds and he has had a continuous slide of depression downward farther than he has ever been. Pt reports it is so bad he is having thoughts of harming himself and has become almost compelled to use a kitchen knife at home to slit his throat when no one is at home. This thinking scares him badly and pt stating "I know that's over the line and I don't want to kill myself I just cant get rid of the thought of it". Patient denies any HI or AVH and does contract for safety on the Unit in that he agrees to alert staff prior to any self harm act should he develop any such thoughts or compulsion. Patient also states he periodically has episodes of pounding, racing, heart, extreme anxiety with impending sense of doom that are paralyzing to him. He even is incontinent of very loose stool at these times and has stopped driving due to fear of such an attack while driving. Patient lives with his mother and is the oldest of 4 siblings, the younger ones being 29 year old twins and a 29 year old brother. Pt reports Mother very supportive as is Dad, but he reports his father was very unpredictable as he was growing up and could be so kind and then be verbally abusive and calling him names and telling him he is worthless, etc. The father is now living separately from the rest of the family as they left him "in the big house" several months ago. Patient is very sad in affect, extremely wellspoken and polite, mild mannered. He is afraid to keep living like this and is desperate to rid himself of the self harm obsession. Pt reports no prior self harm acts and no previous suicidal attempt or gesture. He also  feels overwhelmed by the sudden onset of what sounds like possible anxiety attacks and feels they are crippling his emotional health as well as the bipolar issue.

## 2016-02-07 NOTE — ED Notes (Signed)
Delete  previous message about case Management.

## 2016-02-07 NOTE — ED Notes (Signed)
Pt states he has these episodes when his head hurts, he gets SOB and his chest feels tight. States he gets nauseated and is incontinent of bowels. Pt was given Zofran 4 mg by EMS prior to arrival

## 2016-02-07 NOTE — ED Provider Notes (Signed)
Patient seen/examined in the Emergency Department in conjunction with Resident Physician Provider Rice Patient reports multiple complaints - palpitations, anxious, feeling depressed.  He also reports h/o feeling generalized weakness.  He also endorses fecal incontinence but has had this in the past (over a year ago) and not associated with acute back pain He denies active CP at this time.  Denies h/o CAD/PE Exam : awake/alert, he appears anxious, he is mildly tachycardic but no murmurs or added lung sounds Plan: medical workup initially for palpitations.  His bowel incontinence appears to be non-acute as this has happened in the past with similar episodes per patient/father If medical workup negative, will need psych consult as he is now endorsing suicidal ideation but no active attempt recently    [name], MD 02/07/16 1002

## 2016-02-07 NOTE — BH Assessment (Addendum)
Tele Assessment Note   Dennis Valenzuela is an 30 y.o. male that reports passive suicidal ideation  without a plan.  Patient reports that he wants to resume his psychiatric medication.   Patient reports increased feelings of depression since he stopped taking psychiatric medication (Lithium)  in January 2017.  Patient denies prior psychiatric hospitalizations. Patient reports that he was previously receiving services with [facility].   Patient denied prior suicide attempts.  Patient denies HI/Substance Abuse/Psychosis.  Patient denies physical, sexual or emotional abuse.     Patient reports that he lives with his mother and he has been unemployed for the past year.      Diagnosis: Major Depressive Disorder   Past Medical History:  Past Medical History  Diagnosis Date  . Back pain   . Hypertension   . Seizure (HCC)   . Bipolar disorder (manic depression) [facility](HCC)     Past Surgical History  Procedure Laterality Date  . Appendectomy      Family History: No family history on file.  Social History:  reports that he has been smoking Cigarettes.  He does not have any smokeless tobacco history on file. He reports that he drinks alcohol. He reports that he uses illicit drugs (Marijuana).  Additional Social History:  Alcohol / Drug Use History of alcohol / drug use?: No history of alcohol / drug abuse  CIWA: CIWA-Ar BP: 126/83 mmHg Pulse Rate: 87 COWS:    PATIENT STRENGTHS: (choose at least two) Average or above average intelligence Capable of independent living Communication skills Physical Health Supportive family/friends  Allergies:  Allergies  Allergen Reactions  . Beef-Derived Products Other (See Comments)    unknown  . Penicillins Rash    Has patient had a PCN reaction causing immediate rash, facial/tongue/throat swelling, SOB or lightheadedness with hypotension: Yes Has patient had a PCN reaction causing severe rash involving mucus membranes or skin necrosis:  No Has patient had a PCN reaction that required hospitalization No Has patient had a PCN reaction occurring within the last 10 years: No If all of the above answers are "NO", then may proceed with Cephalosporin use.     Home Medications:  (Not in a hospital admission)  OB/GYN Status:  No LMP for male patient.  General Assessment Data [facility] of Assessment[facility] ([facility] ) TTS Assessment: In system Is this a Tele or Face-to-Face Assessment?: Face-to-Face Is this an Initial Assessment or a Re-assessment for this encounter?: Initial Assessment Marital status: Single Maiden name: NA Is patient pregnant?: No Pregnancy Status: No Living Arrangements: Parent Can pt return to current living arrangement?: Yes Admission Status: Voluntary Is patient capable of signing voluntary admission?: Yes Referral Source: Self/Family/Friend Insurance type: Self Pay     Crisis Care Plan Living Arrangements: Parent Legal Guardian:  (NA) Name of Psychiatrist: None Reported Name of Therapist: None Reported  Education Status Is patient currently in school?: No Current Grade: NA Highest grade of school patient has completed: NA Name of school: NA Contact person: NA  Risk to self with the past 6 months Suicidal Ideation: Yes-Currently Present Has patient been a risk to self within the past 6 months prior to admission? : No Suicidal Intent: No Has patient had any suicidal intent within the past 6 months prior to admission? : No Is patient at risk for suicide?: No Suicidal Plan?: No Has patient had any suicidal plan within the past 6 months prior to admission? : No Access to Means: No What has been  your use of drugs/alcohol within the last 12 months?: NA Previous Attempts/Gestures: No How many times?: 0 Other Self Harm Risks: NA Triggers for Past Attempts:  (NA) Intentional Self Injurious Behavior: None Family Suicide History: No Recent stressful life event(s): Other  (Comment) (Patient stopped taking his medication in January 2017 ) Persecutory voices/beliefs?: No Depression: Yes Depression Symptoms: Despondent, Tearfulness, Fatigue, Loss of interest in usual pleasures, Feeling worthless/self pity Substance abuse history and/or treatment for substance abuse?: No Suicide prevention information given to non-admitted patients: Yes  Risk to Others within the past 6 months Homicidal Ideation: No Does patient have any lifetime risk of violence toward others beyond the six months prior to admission? : No Thoughts of Harm to Others: No Current Homicidal Intent: No Current Homicidal Plan: No Access to Homicidal Means: No Identified Victim: NA History of harm to others?: No Assessment of Violence: None Noted Violent Behavior Description: NA Does patient have access to weapons?: No Criminal Charges Pending?: No Does patient have a court date: No Is patient on probation?: No  Psychosis Hallucinations: None noted Delusions: None noted  Mental Status Report Appearance/Hygiene: Disheveled Eye Contact: Good Motor Activity: Freedom of movement Speech: Logical/coherent Level of Consciousness: Alert Mood: Depressed Affect: Blunted, Depressed Anxiety Level: None Thought Processes: Coherent, Relevant Judgement: Unimpaired Orientation: Place, Person, Time, Situation Obsessive Compulsive Thoughts/Behaviors: None  Cognitive Functioning Concentration: Decreased Memory: Recent Intact, Remote Intact IQ: Average Insight: Fair Impulse Control: Fair Appetite: Fair Weight Loss: 0 Weight Gain: 0 Sleep: Decreased Total Hours of Sleep: 4 Vegetative Symptoms: Decreased grooming  ADLScreening [facility] Assessment Services) Patient's cognitive ability adequate to safely complete daily activities?: Yes Patient able to express need for assistance with ADLs?: Yes Independently performs ADLs?: Yes (appropriate for developmental age)  Prior Inpatient Therapy Prior  Inpatient Therapy: Yes Prior Therapy Dates: 2009 Prior Therapy Facilty/Provider(s): Unable to remember the name Reason for Treatment: Depression   Prior Outpatient Therapy Prior Outpatient Therapy: Yes Prior Therapy Dates: 2016 Prior Therapy Facilty/Provider(s): [facility]  Reason for Treatment: Medication Management Does patient have an ACCT team?: No Does patient have Intensive In-House Services?  : No Does patient have [facility] services? : No Does patient have P4CC services?: No  ADL Screening (condition at time of admission) Patient's cognitive ability adequate to safely complete daily activities?: Yes Is the patient deaf or have difficulty hearing?: No Does the patient have difficulty seeing, even when wearing glasses/contacts?: No Does the patient have difficulty concentrating, remembering, or making decisions?: No Patient able to express need for assistance with ADLs?: Yes Does the patient have difficulty dressing or bathing?: No Independently performs ADLs?: Yes (appropriate for developmental age) Does the patient have difficulty walking or climbing stairs?: No Weakness of Legs: None Weakness of Arms/Hands: None  Home Assistive Devices/Equipment Home Assistive Devices/Equipment: None    Abuse/Neglect Assessment (Assessment to be complete while patient is alone) Physical Abuse: Denies Verbal Abuse: Denies Sexual Abuse: Denies Exploitation of patient/patient's resources: Denies Self-Neglect: Denies Values / Beliefs Cultural Requests During Hospitalization: None Spiritual Requests During Hospitalization: None Consults Spiritual Care Consult Needed: No Social Work Consult Needed: No Merchant navy officer (For Healthcare) Does patient have an advance directive?: No Would patient like information on creating an advanced directive?: No - patient declined information    Additional Information 1:1 In Past 12 Months?: No CIRT Risk: No Elopement Risk: No Does patient  have medical clearance?: Yes     Disposition: Per, Dr. Lucianne Muss - patient meets criteria for inpatient hospitalization.  Disposition Initial  Assessment Completed for this Encounter: Yes Disposition of Patient: Other dispositions  Linton Rump 02/07/2016 12:37 PM

## 2016-02-07 NOTE — ED Provider Notes (Signed)
Pt stable, appears improved Denies drug use Denies ETOH use Denies prescription drug use Will need psych evaluation after he ambulates Pulse ox 95-97% on ambulation   Zadie Rhineonald Shaynah Hund, MD 02/07/16 1142

## 2016-02-08 LAB — BASIC METABOLIC PANEL
Anion gap: 6 (ref 5–15)
BUN: 10 mg/dL (ref 6–20)
CO2: 24 mmol/L (ref 22–32)
CREATININE: 1.09 mg/dL (ref 0.61–1.24)
Calcium: 8.9 mg/dL (ref 8.9–10.3)
Chloride: 113 mmol/L — ABNORMAL HIGH (ref 101–111)
Glucose, Bld: 103 mg/dL — ABNORMAL HIGH (ref 65–99)
POTASSIUM: 3.7 mmol/L (ref 3.5–5.1)
SODIUM: 143 mmol/L (ref 135–145)

## 2016-02-08 LAB — TSH: TSH: 2.328 u[IU]/mL (ref 0.350–4.500)

## 2016-02-08 LAB — T4, FREE: FREE T4: 0.94 ng/dL (ref 0.61–1.12)

## 2016-02-08 MED ORDER — HYDROXYZINE HCL 25 MG PO TABS: 25.0000 mg | ORAL_TABLET | Freq: Four times a day (QID) | ORAL | Status: AC | PRN

## 2016-02-08 MED ORDER — TRAZODONE HCL 50 MG PO TABS: 50.0000 mg | ORAL_TABLET | Freq: Every evening | ORAL | Status: AC | PRN

## 2016-02-08 NOTE — Discharge Instructions (Signed)
Patient will be discharged this date and will follow up with [facility] [place], [place]. Patient has been receiving services from that facility and will continue there with medication management and therapy to address substance abuse issues. Patient was discharged with other outpatient resources as we discussed discharge planning to include, a relapse prevention plan, trigger identification and medication management. Patient was also given information on anxiety disorders and effective treatment inventions to utilize to better manage anxiety.

## 2016-02-08 NOTE — Progress Notes (Signed)
Nursing Shift Note  Patient is currently sleeping, states "I 'm not really thinking about it right now" when Nurse asking him if he is having any thoughts of harming himself. Nurse asking patient if he will contract for safety on the Unit and pt stating that he can. Patient also denies any HI or AVH, is compliant with medications. Nurse administering medications as ordered, providng therapeutic communication and emotional support as well as ensuring continuous observation for safety except when patient is in the bathroom. Patient without any undesirable behavior and remains safe on Unit.

## 2016-02-08 NOTE — Discharge Summary (Signed)
[facility] OBS UNIT DISCHARGE SUMMARY  Patient Identification: Dennis Valenzuela MRN:  [id number] Date of Evaluation:  02/08/2016 Principal Diagnosis: Bipolar I disorder, most recent episode depressed ([place]) Diagnosis:   Patient Active Problem List   Diagnosis Date Noted  . Bipolar I disorder, most recent episode depressed ([place]) [F31.30] 02/07/2016   Subjective: Pt seen and chart reviewed. Pt is alert/oriented x4, calm, cooperative, and appropriate to situation. Pt denies suicidal/homicidal ideation and psychosis and does not appear to be responding to internal stimuli. Pt reports that he feels much better after resting in the [facility] OBS UNIT and is optimistic about his outpatient treatment.   History of Present Illness:: I have reviewed and concur with HPI elements from my colleague Elmarie Shiley, NP, modified as below:  Dennis Valenzuela is a 30 year old male who presented to the [facility] reporting suicidal ideation, anxiety, palpitations and desire to be restarted on his psychiatric medication. He was medically cleared after complaining of headache and diarrhea. Patient reports increased feelings of depression since he stopped taking psychiatric medication (Lithium) in January 2017. Patient reports that he was previously receiving services with [facility] but did not follow up as directed and ran out of Lithium.  Patient denies prior suicide attempts. Patient denies HI/Substance Abuse/Psychosis. Patient denies physical, sexual or emotional abuse.Patient reports that he lives with his mother and he has been unemployed for the past year.The patient states "I have periods when I don't need much sleep for a month. But I still have lots of energy. My thoughts race around. Then I crash with the depression. I got so scared yesterday because I had a strong urge to cut my throat with a knife in the kitchen. But I love myself.  I have been feeling this way for two weeks now. I am very depressed. When I think back I did  good on the Lithium with my mood being controlled. It was also affordable and I do not have any insurance. In the past Depakote and Zyprexa made me gain weight. I know I need to do better about follow up. I wonder if there is a place closer to where I live. I am still having these thoughts of hurting myself but I feel safe here. I was diagnosed with Bipolar at age 39 when I was at [facility]. I would like to try the Lithium again. I did not have any side effects in the past when I took it." Patient was very cooperative with the interview today appearing depressed throughout. He continues to have suicidal thoughts but is able to contract for safety. The patient is concerned about the severe nature of his thoughts stating "I have thought about it before but never had a plan like this." His alcohol level is negative and urine drug screen is positive for marijuana. Patient denies alcohol abuse but his AST and ALT are noted to be elevated on his chemistry panel. His vitals are currently stable with no evidence of acute withdrawal symptoms.   Pt spent the night in the [facility] without incident. Seen again on 02/08/2016 as above.   Total Time spent with patient: 45 minutes  Past Psychiatric History: Bipolar 1   Prior Inpatient Therapy:  [facility] at age 18  Prior Outpatient Therapy:   [facility]   Alcohol Screening: 1. How often do you have a drink containing alcohol?: Never 9. Have you or someone else been injured as a result of your drinking?: No 10. Has a relative or friend  or a doctor or another health worker been concerned about your drinking or suggested you cut down?: No Alcohol Use Disorder Identification Test Final Score (AUDIT): 0 Brief Intervention: AUDIT score less than 7 or less-screening does not suggest unhealthy drinking-brief intervention not indicated Substance Abuse History in the last 12 months:  Yes.   His urine drug screen is positive for marijuana.  Consequences of Substance  Abuse: Possible negative effects of marijuana on his mental health.  Previous Psychotropic Medications: Yes, Lithium, Zyprexa, Depakote  Psychological Evaluations: No  Past Medical History:  Past Medical History  Diagnosis Date  . Back pain   . Hypertension   . Seizure ([place])   . Bipolar disorder (manic depression) [facility])     Past Surgical History  Procedure Laterality Date  . Appendectomy     Family History: History reviewed. No pertinent family history. Family Psychiatric  History: Patient denies Tobacco Screening: Denies Social History:  History  Alcohol Use  . Yes    Comment: rarely     History  Drug Use  . Yes  . Special: Marijuana    Additional Social History:                           Allergies:   Allergies  Allergen Reactions  . Beef-Derived Products Other (See Comments)    unknown  . Penicillins Rash    Has patient had a PCN reaction causing immediate rash, facial/tongue/throat swelling, SOB or lightheadedness with hypotension: Yes Has patient had a PCN reaction causing severe rash involving mucus membranes or skin necrosis: No Has patient had a PCN reaction that required hospitalization No Has patient had a PCN reaction occurring within the last 10 years: No If all of the above answers are "NO", then may proceed with Cephalosporin use.    Lab Results:  Results for orders placed or performed during the hospital encounter of 02/07/16 (from the past 48 hour(s))  TSH     Status: None   Collection Time: 02/08/16  6:25 AM  Result Value Ref Range   TSH 2.328 0.350 - 4.500 uIU/mL    Comment: Performed at [facility] metabolic panel     Status: Abnormal   Collection Time: 02/08/16  6:25 AM  Result Value Ref Range   Sodium 143 135 - 145 mmol/L   Potassium 3.7 3.5 - 5.1 mmol/L   Chloride 113 (H) 101 - 111 mmol/L   CO2 24 22 - 32 mmol/L   Glucose, Bld 103 (H) 65 - 99 mg/dL   BUN 10 6 - 20 mg/dL   Creatinine, Ser 1.09 0.61  - 1.24 mg/dL   Calcium 8.9 8.9 - 10.3 mg/dL   GFR calc non Af Amer >60 >60 mL/min   GFR calc Af Amer >60 >60 mL/min    Comment: (NOTE) The eGFR has been calculated using the CKD EPI equation. This calculation has not been validated in all clinical situations. eGFR's persistently <60 mL/min signify possible Chronic Kidney Disease.    Anion gap 6 5 - 15    Comment: Performed at [facility]    Blood Alcohol level:  Lab Results  Component Value Date   [facility] <5 02/07/2016   ETH <5 16/07/9603    Metabolic Disorder Labs:  No results found for: HGBA1C, MPG No results found for: PROLACTIN No results found for: CHOL, TRIG, HDL, CHOLHDL, VLDL, LDLCALC  Current Medications: Current Facility-Administered Medications  Medication  Dose Route Frequency Provider Last Rate Last Dose  . acetaminophen (TYLENOL) tablet 650 mg  650 mg Oral Q6H PRN Niel Hummer, NP      . alum & mag hydroxide-simeth (MAALOX/MYLANTA) 200-200-20 MG/5ML suspension 30 mL  30 mL Oral Q4H PRN Niel Hummer, NP      . hydrOXYzine (ATARAX/VISTARIL) tablet 25 mg  25 mg Oral Q6H PRN Niel Hummer, NP      . lithium carbonate (LITHOBID) CR tablet 300 mg  300 mg Oral Q12H Niel Hummer, NP   300 mg at 02/08/16 0805  . magnesium hydroxide (MILK OF MAGNESIA) suspension 30 mL  30 mL Oral Daily PRN Niel Hummer, NP      . traZODone (DESYREL) tablet 50 mg  50 mg Oral QHS PRN Niel Hummer, NP   50 mg at 02/07/16 2146   PTA Medications: Prescriptions prior to admission  Medication Sig Dispense Refill Last Dose  . lithium 300 MG tablet Take 1 tablet (300 mg total) by mouth 2 (two) times daily. (Patient not taking: Reported on 02/07/2016) 60 tablet 0     Musculoskeletal: Strength & Muscle Tone: within normal limits Gait & Station: normal Patient leans: N/A  Psychiatric Specialty Exam: Physical Exam  Review of Systems  Psychiatric/Behavioral: Positive for substance abuse. Negative for depression, suicidal  ideas and hallucinations. The patient is nervous/anxious and has insomnia.   All other systems reviewed and are negative.   Blood pressure 119/87, pulse 64, temperature 98 F (36.7 C), temperature source Oral, resp. rate 18, height _0  (1.854 m), weight 117.141 kg (258 lb 4 oz), SpO2 98 %.Body mass index is 34.08 kg/(m^2).  General Appearance: Casual  Eye Contact::  Good  Speech:  Clear and Coherent  Volume:  Normal  Mood:  Depressed  Affect:  Constricted  Thought Process:  Goal Directed and Intact  Orientation:  Full (Time, Place, and Person)  Thought Content:  Symptoms, worries, concerns  Suicidal Thoughts:  No and contracts for safety  Homicidal Thoughts:  No  Memory:  Immediate;   Good Recent;   Good Remote;   Good  Judgement:  Fair  Insight:  Present  Psychomotor Activity:  Decreased  Concentration:  Good  Recall:  Good  Fund of Knowledge:Good  Language: Good  Akathisia:  No  Handed:  Right  AIMS (if indicated):     Assets:  Communication Skills Desire for Improvement Housing Intimacy Leisure Time Physical Health Resilience Social Support Talents/Skills  ADL's:  Intact  Cognition: WNL  Sleep:       Treatment Plan Summary: Bipolar I disorder, most recent episode depressed ([place]), improving, stable for outpatient management.   Disposition: -Discharge home with outpatient resources for psychiatry/counseling -14 day Rx for Vistaril/Trazodone  Benjamine Mola, FNP 02/08/2016 2:01 PM

## 2016-02-08 NOTE — BH Assessment (Signed)
[facility] Assessment Progress Note Patient will be discharged this date and will follow up with [facility] in [place], [place]. Patient has been receiving services from that facility and will continue there with medication management and therapy to address substance abuse issues. Patient was discharged with other outpatient resources as we discussed discharge planning to include, a relapse prevention plan, trigger identification and medication management. Patient was also given information on anxiety disorders and effective treatment inventions to utilize to better manage anxiety.

## 2016-02-08 NOTE — Progress Notes (Signed)
Nursing Discharge Note:  Patient receiving copy of AVS to keep for himself, and signing the other copy of the AVS to remain on the chart. Patient brought no belongings with him so none returned and he remains in Hospital issued scrubs and footies. Patient denies any active SI, HI, or AVH. Patient receiving prescriptions and states understanding of what medications he is to take and where and when he is to followup for continuing care, Nurse escorting patient to the Lobby where his Father is here to pick him up.

## 2016-02-09 LAB — HEPATITIS PANEL, ACUTE
Hep A IgM: NEGATIVE
Hep B C IgM: NEGATIVE
Hepatitis B Surface Ag: NEGATIVE

## 2017-06-07 IMAGING — CR DG CHEST 2V
2 series · 2 of 2 positions shown · non-contrast
Comparison: February 21, 2012.

CLINICAL DATA: Shortness of breath and chest tightness for several
days

EXAM:
CHEST  2 VIEW

[view not recorded (1 of 2)]
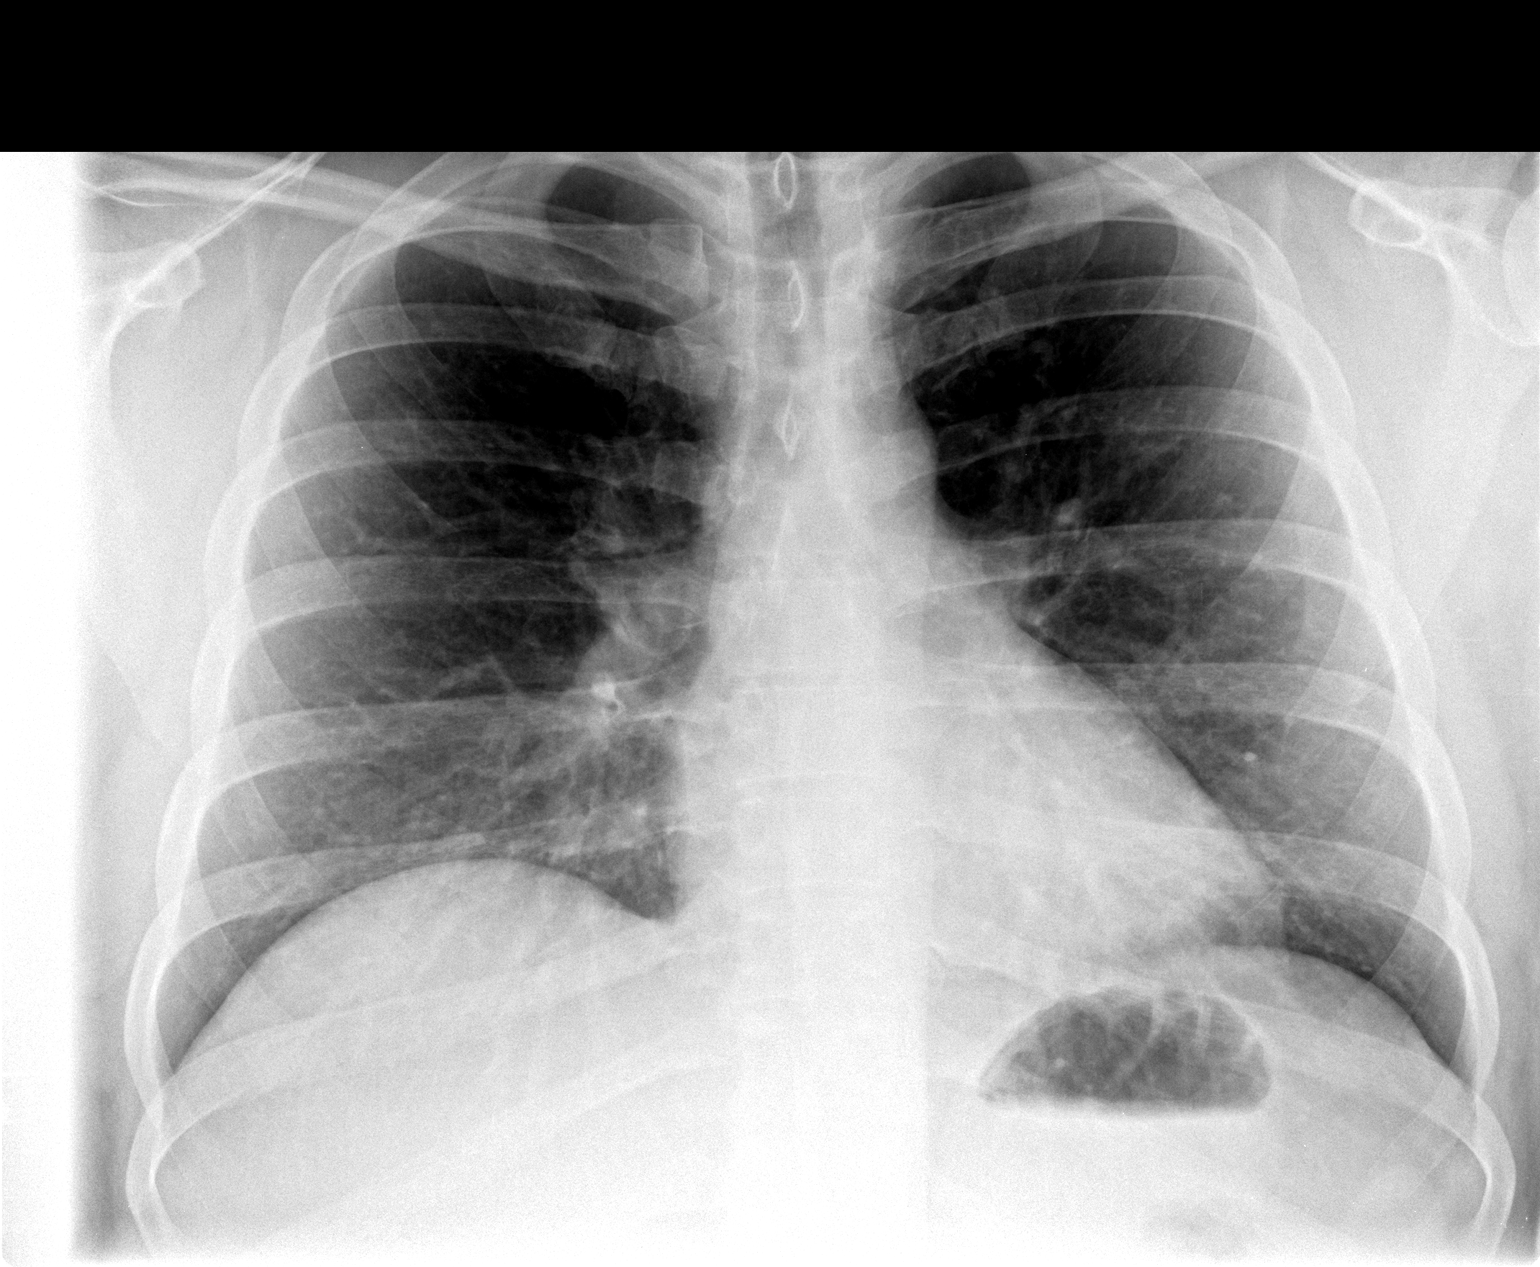

[view not recorded (2 of 2)]
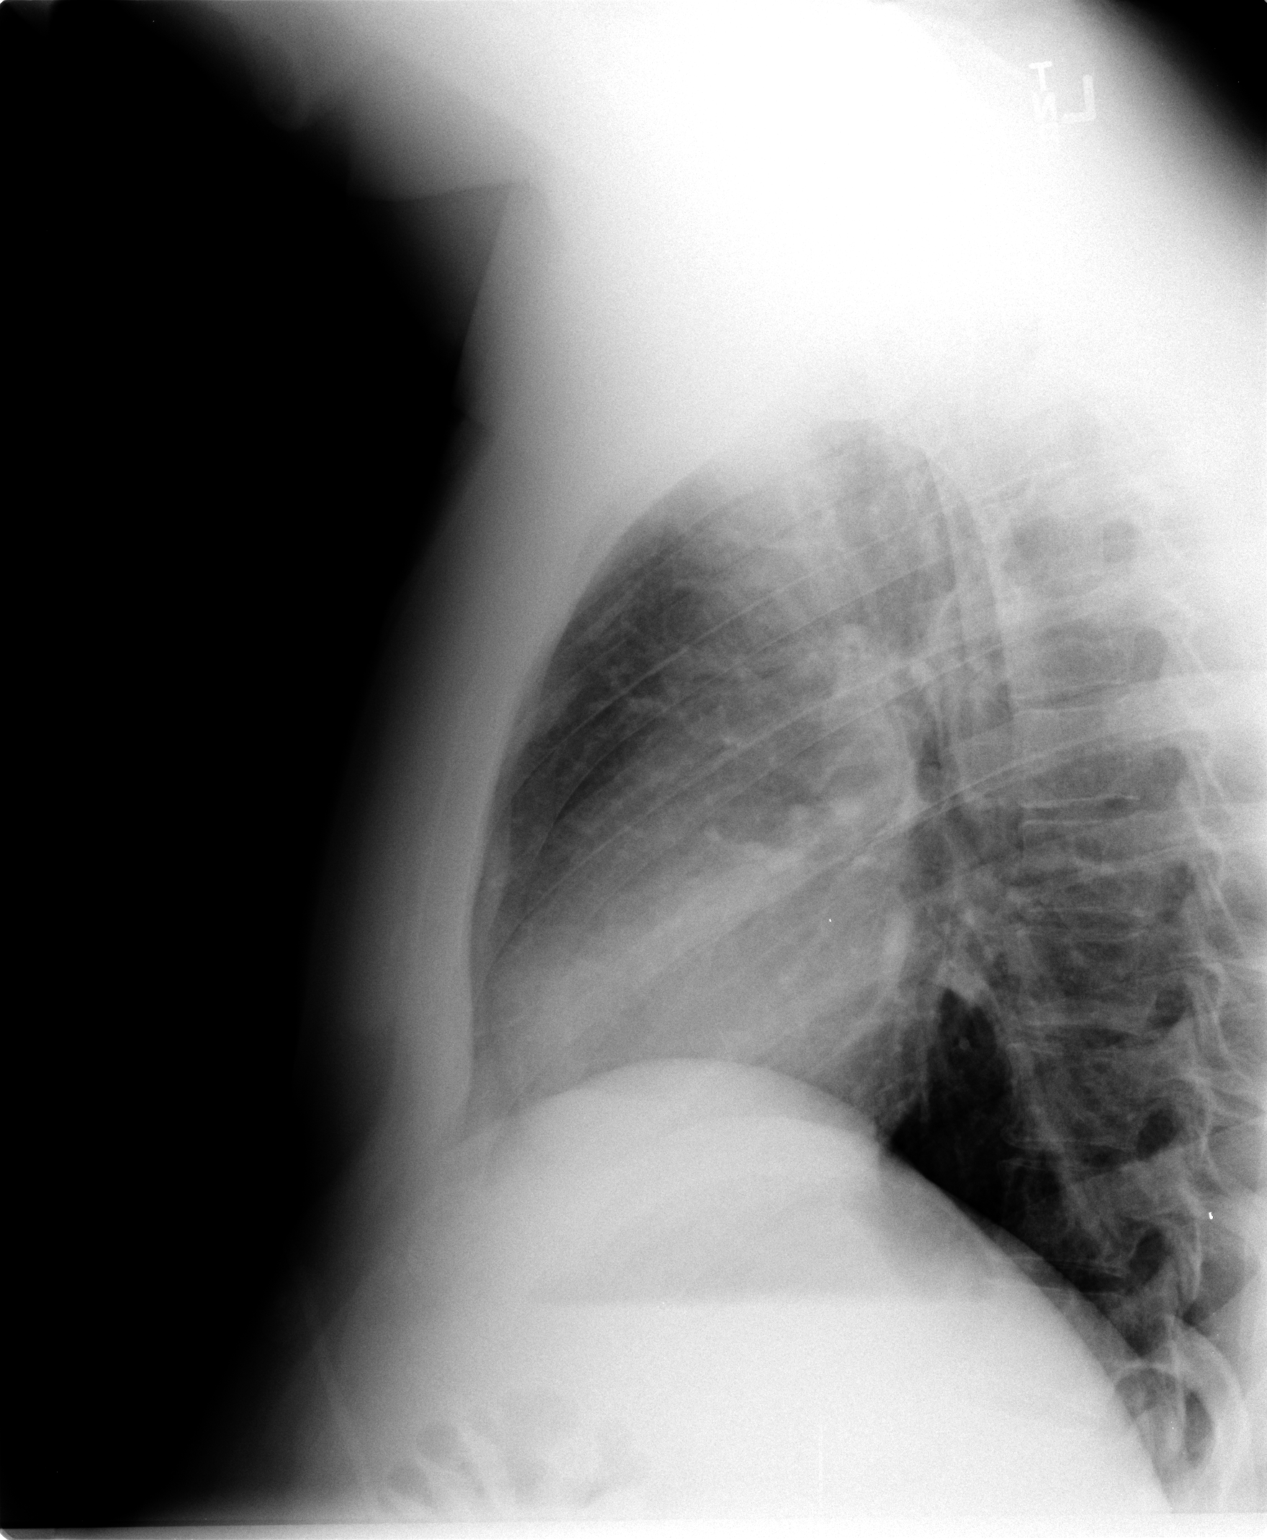

[2 of 2 positions shown; findings below may reference images not displayed]

FINDINGS: Lungs are clear. Heart size and pulmonary vascularity are normal. No
adenopathy. No bone lesions. No pneumothorax.
IMPRESSION: No edema or consolidation.
